# Patient Record
Sex: Female | Born: 1960 | Race: White | Hispanic: No | Marital: Single | Smoking: Never smoker
Health system: Southern US, Community
[De-identification: ages and names within clinical notes are randomized; demographics above are authoritative.]

## PROBLEM LIST (undated history)

## (undated) DIAGNOSIS — E785 Hyperlipidemia, unspecified: Secondary | ICD-10-CM

## (undated) DIAGNOSIS — M1990 Unspecified osteoarthritis, unspecified site: Secondary | ICD-10-CM

## (undated) DIAGNOSIS — F329 Major depressive disorder, single episode, unspecified: Secondary | ICD-10-CM

## (undated) DIAGNOSIS — D649 Anemia, unspecified: Secondary | ICD-10-CM

## (undated) DIAGNOSIS — I10 Essential (primary) hypertension: Secondary | ICD-10-CM

## (undated) DIAGNOSIS — F32A Depression, unspecified: Secondary | ICD-10-CM

---
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
## 1999-05-26 ENCOUNTER — Other Ambulatory Visit: Admission: RE | Admit: 1999-05-26 | Discharge: 1999-05-26 | Payer: Self-pay | Admitting: *Deleted

## 1999-08-16 ENCOUNTER — Encounter: Payer: Self-pay | Admitting: Emergency Medicine

## 1999-08-16 ENCOUNTER — Emergency Department (HOSPITAL_COMMUNITY): Admission: EM | Admit: 1999-08-16 | Discharge: 1999-08-16 | Payer: Self-pay | Admitting: Emergency Medicine

## 2000-06-05 ENCOUNTER — Other Ambulatory Visit: Admission: RE | Admit: 2000-06-05 | Discharge: 2000-06-05 | Payer: Self-pay | Admitting: *Deleted

## 2001-05-15 ENCOUNTER — Other Ambulatory Visit: Admission: RE | Admit: 2001-05-15 | Discharge: 2001-05-15 | Payer: Self-pay | Admitting: *Deleted

## 2004-12-26 ENCOUNTER — Other Ambulatory Visit: Admission: RE | Admit: 2004-12-26 | Discharge: 2004-12-26 | Payer: Self-pay | Admitting: Family Medicine

## 2019-10-07 ENCOUNTER — Inpatient Hospital Stay (HOSPITAL_COMMUNITY): Payer: 59

## 2019-10-07 ENCOUNTER — Inpatient Hospital Stay (HOSPITAL_COMMUNITY)
Admission: AD | Admit: 2019-10-07 | Discharge: 2019-10-12 | DRG: 177 | Disposition: A | Discharge status: Home / Self Care | Payer: 59 | Source: Other Acute Inpatient Hospital | Attending: Internal Medicine | Admitting: Internal Medicine

## 2019-10-07 ENCOUNTER — Encounter (HOSPITAL_COMMUNITY): Payer: Self-pay | Admitting: Family Medicine

## 2019-10-07 ENCOUNTER — Other Ambulatory Visit: Payer: Self-pay

## 2019-10-07 DIAGNOSIS — I10 Essential (primary) hypertension: Secondary | ICD-10-CM | POA: Diagnosis not present

## 2019-10-07 DIAGNOSIS — E669 Obesity, unspecified: Secondary | ICD-10-CM | POA: Diagnosis present

## 2019-10-07 DIAGNOSIS — F419 Anxiety disorder, unspecified: Secondary | ICD-10-CM | POA: Diagnosis present

## 2019-10-07 DIAGNOSIS — Z7901 Long term (current) use of anticoagulants: Secondary | ICD-10-CM | POA: Diagnosis not present

## 2019-10-07 DIAGNOSIS — J9600 Acute respiratory failure, unspecified whether with hypoxia or hypercapnia: Secondary | ICD-10-CM | POA: Diagnosis present

## 2019-10-07 DIAGNOSIS — R0600 Dyspnea, unspecified: Secondary | ICD-10-CM

## 2019-10-07 DIAGNOSIS — E785 Hyperlipidemia, unspecified: Secondary | ICD-10-CM | POA: Diagnosis present

## 2019-10-07 DIAGNOSIS — Z79899 Other long term (current) drug therapy: Secondary | ICD-10-CM | POA: Diagnosis not present

## 2019-10-07 DIAGNOSIS — Z881 Allergy status to other antibiotic agents status: Secondary | ICD-10-CM | POA: Diagnosis not present

## 2019-10-07 DIAGNOSIS — U071 COVID-19: Principal | ICD-10-CM | POA: Diagnosis present

## 2019-10-07 DIAGNOSIS — Z8249 Family history of ischemic heart disease and other diseases of the circulatory system: Secondary | ICD-10-CM

## 2019-10-07 DIAGNOSIS — E875 Hyperkalemia: Secondary | ICD-10-CM | POA: Diagnosis not present

## 2019-10-07 DIAGNOSIS — J9601 Acute respiratory failure with hypoxia: Secondary | ICD-10-CM | POA: Diagnosis not present

## 2019-10-07 DIAGNOSIS — E878 Other disorders of electrolyte and fluid balance, not elsewhere classified: Secondary | ICD-10-CM | POA: Diagnosis not present

## 2019-10-07 DIAGNOSIS — Z882 Allergy status to sulfonamides status: Secondary | ICD-10-CM | POA: Diagnosis not present

## 2019-10-07 DIAGNOSIS — Z880 Allergy status to penicillin: Secondary | ICD-10-CM | POA: Diagnosis not present

## 2019-10-07 DIAGNOSIS — J302 Other seasonal allergic rhinitis: Secondary | ICD-10-CM | POA: Diagnosis present

## 2019-10-07 DIAGNOSIS — Z6830 Body mass index (BMI) 30.0-30.9, adult: Secondary | ICD-10-CM

## 2019-10-07 DIAGNOSIS — F329 Major depressive disorder, single episode, unspecified: Secondary | ICD-10-CM | POA: Diagnosis present

## 2019-10-07 DIAGNOSIS — E871 Hypo-osmolality and hyponatremia: Secondary | ICD-10-CM | POA: Diagnosis not present

## 2019-10-07 DIAGNOSIS — J1289 Other viral pneumonia: Secondary | ICD-10-CM | POA: Diagnosis present

## 2019-10-07 DIAGNOSIS — J1282 Pneumonia due to coronavirus disease 2019: Secondary | ICD-10-CM

## 2019-10-07 HISTORY — DX: Acute respiratory failure, unspecified whether with hypoxia or hypercapnia: J96.00

## 2019-10-07 HISTORY — DX: Hyperlipidemia, unspecified: E78.5

## 2019-10-07 HISTORY — DX: Anemia, unspecified: D64.9

## 2019-10-07 HISTORY — DX: Depression, unspecified: F32.A

## 2019-10-07 HISTORY — DX: Unspecified osteoarthritis, unspecified site: M19.90

## 2019-10-07 HISTORY — DX: Essential (primary) hypertension: I10

## 2019-10-07 LAB — CBC
HCT: 38.2 % (ref 36.0–46.0)
Hemoglobin: 12.8 g/dL (ref 12.0–15.0)
MCH: 30.1 pg (ref 26.0–34.0)
MCHC: 33.5 g/dL (ref 30.0–36.0)
MCV: 89.9 fL (ref 80.0–100.0)
Platelets: 390 10*3/uL (ref 150–400)
RBC: 4.25 MIL/uL (ref 3.87–5.11)
RDW: 12.8 % (ref 11.5–15.5)
WBC: 5.7 10*3/uL (ref 4.0–10.5)
nRBC: 0.4 % — ABNORMAL HIGH (ref 0.0–0.2)

## 2019-10-07 LAB — COMPREHENSIVE METABOLIC PANEL
ALT: 50 U/L — ABNORMAL HIGH (ref 0–44)
AST: 25 U/L (ref 15–41)
Albumin: 3.3 g/dL — ABNORMAL LOW (ref 3.5–5.0)
Alkaline Phosphatase: 55 U/L (ref 38–126)
Anion gap: 12 (ref 5–15)
BUN: 14 mg/dL (ref 6–20)
CO2: 23 mmol/L (ref 22–32)
Calcium: 8.7 mg/dL — ABNORMAL LOW (ref 8.9–10.3)
Chloride: 101 mmol/L (ref 98–111)
Creatinine, Ser: 0.69 mg/dL (ref 0.44–1.00)
GFR calc Af Amer: >60 mL/min (ref >60)
GFR calc non Af Amer: >60 mL/min (ref >60)
Glucose, Bld: 142 mg/dL — ABNORMAL HIGH (ref 70–99)
Potassium: 3.7 mmol/L (ref 3.5–5.1)
Sodium: 136 mmol/L (ref 135–145)
Total Bilirubin: 0.5 mg/dL (ref 0.3–1.2)
Total Protein: 7 g/dL (ref 6.5–8.1)

## 2019-10-07 LAB — ABO/RH: ABO/RH(D): O POS

## 2019-10-07 LAB — CREATININE, SERUM
Creatinine, Ser: 0.69 mg/dL (ref 0.44–1.00)
GFR calc Af Amer: >60 mL/min (ref >60)
GFR calc non Af Amer: >60 mL/min (ref >60)

## 2019-10-07 LAB — D-DIMER, QUANTITATIVE: D-Dimer, Quant: 0.66 ug/mL-FEU — ABNORMAL HIGH (ref 0.00–0.50)

## 2019-10-07 LAB — FERRITIN: Ferritin: 532 ng/mL — ABNORMAL HIGH (ref 11–307)

## 2019-10-07 LAB — C-REACTIVE PROTEIN: CRP: 4.9 mg/dL — ABNORMAL HIGH (ref <1.0)

## 2019-10-07 MED ORDER — HYDROCOD POLST-CPM POLST ER 10-8 MG/5ML PO SUER
5.0000 mL | Freq: Two times a day (BID) | ORAL | Status: DC
  Administered 2019-10-07 – 2019-10-12 (×11): 5 mL via ORAL
  Filled 2019-10-07 (×11): qty 5

## 2019-10-07 MED ORDER — FAMOTIDINE 20 MG PO TABS: 10.0000 mg | ORAL_TABLET | Freq: Every day | ORAL | Status: DC | PRN

## 2019-10-07 MED ORDER — GUAIFENESIN-DM 100-10 MG/5ML PO SYRP
10.0000 mL | ORAL_SOLUTION | ORAL | Status: DC | PRN
  Administered 2019-10-07 – 2019-10-08 (×2): 10 mL via ORAL
  Filled 2019-10-07 (×2): qty 10

## 2019-10-07 MED ORDER — ASCORBIC ACID 500 MG PO TABS
500.0000 mg | ORAL_TABLET | Freq: Every day | ORAL | Status: DC
  Administered 2019-10-07 – 2019-10-12 (×6): 500 mg via ORAL
  Filled 2019-10-07 (×7): qty 1

## 2019-10-07 MED ORDER — ONDANSETRON HCL 4 MG PO TABS: 4.0000 mg | ORAL_TABLET | Freq: Four times a day (QID) | ORAL | Status: DC | PRN

## 2019-10-07 MED ORDER — SODIUM CHLORIDE 0.9 % IV SOLN: 200.0000 mg | Freq: Once | INTRAVENOUS | Status: DC

## 2019-10-07 MED ORDER — ONDANSETRON HCL 4 MG/2ML IJ SOLN: 4.0000 mg | Freq: Four times a day (QID) | INTRAMUSCULAR | Status: DC | PRN

## 2019-10-07 MED ORDER — SODIUM CHLORIDE 0.9% FLUSH
3.0000 mL | Freq: Two times a day (BID) | INTRAVENOUS | Status: DC
  Administered 2019-10-07 – 2019-10-09 (×5): 3 mL via INTRAVENOUS

## 2019-10-07 MED ORDER — SODIUM CHLORIDE 0.9 % IV SOLN: 100.0000 mg | Freq: Every day | INTRAVENOUS | Status: DC

## 2019-10-07 MED ORDER — ZINC SULFATE 220 (50 ZN) MG PO CAPS
220.0000 mg | ORAL_CAPSULE | Freq: Every day | ORAL | Status: DC
  Administered 2019-10-07 – 2019-10-12 (×6): 220 mg via ORAL
  Filled 2019-10-07 (×6): qty 1

## 2019-10-07 MED ORDER — ENOXAPARIN SODIUM 40 MG/0.4ML ~~LOC~~ SOLN
40.0000 mg | SUBCUTANEOUS | Status: DC
  Administered 2019-10-07 – 2019-10-12 (×6): 40 mg via SUBCUTANEOUS
  Filled 2019-10-07 (×6): qty 0.4

## 2019-10-07 MED ORDER — SODIUM CHLORIDE 0.9 % IV SOLN: 250.0000 mL | INTRAVENOUS | Status: DC | PRN

## 2019-10-07 MED ORDER — DEXAMETHASONE SODIUM PHOSPHATE 10 MG/ML IJ SOLN: 6.0000 mg | INTRAMUSCULAR | Status: DC

## 2019-10-07 MED ORDER — ENSURE ENLIVE PO LIQD
237.0000 mL | Freq: Three times a day (TID) | ORAL | Status: DC
  Administered 2019-10-07 – 2019-10-10 (×9): 237 mL via ORAL

## 2019-10-07 MED ORDER — LORATADINE 10 MG PO TABS
10.0000 mg | ORAL_TABLET | Freq: Every day | ORAL | Status: DC | PRN
  Administered 2019-10-07 – 2019-10-11 (×4): 10 mg via ORAL
  Filled 2019-10-07 (×4): qty 1

## 2019-10-07 MED ORDER — METHYLPREDNISOLONE SODIUM SUCC 40 MG IJ SOLR
35.0000 mg | Freq: Two times a day (BID) | INTRAMUSCULAR | Status: DC
  Administered 2019-10-07: 35 mg via INTRAVENOUS
  Filled 2019-10-07: qty 1

## 2019-10-07 MED ORDER — METHYLPREDNISOLONE SODIUM SUCC 40 MG IJ SOLR
40.0000 mg | Freq: Every day | INTRAMUSCULAR | Status: DC
  Administered 2019-10-08 – 2019-10-12 (×5): 40 mg via INTRAVENOUS
  Filled 2019-10-07 (×5): qty 1

## 2019-10-07 MED ORDER — SODIUM CHLORIDE 0.9 % IV SOLN
100.0000 mg | Freq: Every day | INTRAVENOUS | Status: AC
  Administered 2019-10-07 – 2019-10-10 (×4): 100 mg via INTRAVENOUS
  Filled 2019-10-07 (×4): qty 20

## 2019-10-07 MED ORDER — HYDROCOD POLST-CPM POLST ER 10-8 MG/5ML PO SUER: 5.0000 mL | Freq: Two times a day (BID) | ORAL | Status: DC | PRN

## 2019-10-07 MED ORDER — ACETAMINOPHEN 325 MG PO TABS: 650.0000 mg | ORAL_TABLET | Freq: Four times a day (QID) | ORAL | Status: DC | PRN

## 2019-10-07 MED ORDER — SODIUM CHLORIDE 0.9% FLUSH: 3.0000 mL | INTRAVENOUS | Status: DC | PRN

## 2019-10-07 NOTE — H&P (Signed)
History and Physical    Heidi Schmidt EXN:[id number] DOB: Mar 01, 1961 DOA: 10/07/2019  PCP: No primary care provider on file.  Patient coming from: Home  Chief Complaint: Shortness of breath  HPI: Heidi Schmidt is a 58 y.o. female with medical history significant of hypertension, hyperlipidemia diagnosed with Covid on 09/27/2019 seen in [place] ED on 12/14 and again on 12/17 at which point she was discharged with oral dexamethasone and 2 L of oxygen.  She continued to get worsening shortness of breath and return to emergency department more hypoxic on 2 L.  She also stopped her dexamethasone due to facial flushing and subjective fever prior.  She denies any nausea vomiting diarrhea.  She denies any chest pain.  Today she was satting 81% on 2 L had to be turned up to 15 L/min sat now at 96% on high flow nasal cannula.  She is currently in no respiratory distress she has bilateral infiltrates on chest x-ray and is referred for admission for acute hypoxic respiratory failure secondary to bilateral Covid pneumonia.  Review of Systems: As per HPI otherwise 10 point review of systems negative.   Past Medical History:  Diagnosis Date  . Anemia   . Arthritis   . Depression   . HLD (hyperlipidemia)   . Hypertension     Past Surgical History:  Procedure Laterality Date  . CESAREAN SECTION       reports that she has never smoked. She has never used smokeless tobacco. She reports previous alcohol use. She reports that she does not use drugs.  Allergies  Allergen Reactions  . Dexamethasone Other (See Comments)    facial flushing  . Cephalexin Rash  . Penicillins Rash  . Sulfa Antibiotics Rash    Family History  Problem Relation Age of Onset  . Hypertension Mother   . Diabetes Mother   . Cancer Mother   . Heart disease Mother   . Diabetes Brother   . Diabetes Maternal Grandfather     Prior to Admission medications   Medication Sig Start Date End Date Taking? Authorizing Provider    cefpodoxime (VANTIN) 200 MG tablet Take 200 mg by mouth 2 (two) times daily.   Yes [provider]  cetirizine (ZYRTEC) 10 MG chewable tablet Chew 10 mg by mouth 2 (two) times daily as needed for allergies.   Yes [provider]  famotidine (PEPCID) 10 MG tablet Take 10 mg by mouth 3 times/day as needed-between meals & bedtime for heartburn or indigestion.   Yes [provider]  glucosamine-chondroitin 500-400 MG tablet Take 1 tablet by mouth 3 (three) times daily.   Yes [provider]  rivaroxaban (XARELTO) 10 MG TABS tablet Take 10 mg by mouth 2 (two) times daily.   Yes [provider]    Physical Exam: Vitals:   10/07/19 0122 10/07/19 0219 10/07/19 0353 10/07/19 0407  BP:    (!) 143/80  Pulse:    90  Resp: (!) 21 (!) 22 (!) 22 (!) 22  Temp:    99 F (37.2 C)  TempSrc:    Oral  SpO2: 94% 96%  90%  Weight:      Height:          Constitutional: NAD, calm, comfortable Vitals:   10/07/19 0122 10/07/19 0219 10/07/19 0353 10/07/19 0407  BP:    (!) 143/80  Pulse:    90  Resp: (!) 21 (!) 22 (!) 22 (!) 22  Temp:    99 F (37.2 C)  TempSrc:    Oral  SpO2: 94% 96%  90%  Weight:      Height:       Eyes: PERRL, lids and conjunctivae normal ENMT: Mucous membranes are moist. Posterior pharynx clear of any exudate or lesions.Normal dentition.  Neck: normal, supple, no masses, no thyromegaly Respiratory: clear to auscultation bilaterally, no wheezing, no crackles. Normal respiratory effort. No accessory muscle use.  Cardiovascular: Regular rate and rhythm, no murmurs / rubs / gallops. No extremity edema. 2+ pedal pulses. No carotid bruits.  Abdomen: no tenderness, no masses palpated. No hepatosplenomegaly. Bowel sounds positive.  Musculoskeletal: no clubbing / cyanosis. No joint deformity upper and lower extremities. Good ROM, no contractures. Normal muscle tone.  Skin: no rashes, lesions, ulcers. No induration Neurologic: CN 2-12 grossly  intact. Sensation intact, DTR normal. Strength 5/5 in all 4.  Psychiatric: Normal judgment and insight. Alert and oriented x 3. Normal mood.    Labs on Admission: I have personally reviewed following labs and imaging studies  CBC: No results for input(s): WBC, NEUTROABS, HGB, HCT, MCV, PLT in the last 168 hours. Basic Metabolic Panel: No results for input(s): NA, K, CL, CO2, GLUCOSE, BUN, CREATININE, CALCIUM, MG, PHOS in the last 168 hours. GFR: CrCl cannot be calculated (No successful lab value found.). Liver Function Tests: No results for input(s): AST, ALT, ALKPHOS, BILITOT, PROT, ALBUMIN in the last 168 hours. No results for input(s): LIPASE, AMYLASE in the last 168 hours. No results for input(s): AMMONIA in the last 168 hours. Coagulation Profile: No results for input(s): INR, PROTIME in the last 168 hours. Cardiac Enzymes: No results for input(s): CKTOTAL, CKMB, CKMBINDEX, TROPONINI in the last 168 hours. BNP (last 3 results) No results for input(s): PROBNP in the last 8760 hours. HbA1C: No results for input(s): HGBA1C in the last 72 hours. CBG: No results for input(s): GLUCAP in the last 168 hours. Lipid Profile: No results for input(s): CHOL, HDL, LDLCALC, TRIG, CHOLHDL, LDLDIRECT in the last 72 hours. Thyroid Function Tests: No results for input(s): TSH, T4TOTAL, FREET4, T3FREE, THYROIDAB in the last 72 hours. Anemia Panel: No results for input(s): VITAMINB12, FOLATE, FERRITIN, TIBC, IRON, RETICCTPCT in the last 72 hours. Urine analysis: No results found for: COLORURINE, APPEARANCEUR, LABSPEC, PHURINE, GLUCOSEU, HGBUR, BILIRUBINUR, KETONESUR, PROTEINUR, UROBILINOGEN, NITRITE, LEUKOCYTESUR Sepsis Labs: !!!!!!!!!!!!!!!!!!!!!!!!!!!!!!!!!!!!!!!!!!!! @LABRCNTIP (procalcitonin:4,lacticidven:4) )No results found for this or any previous visit (from the past 240 hour(s)).   Radiological Exams on Admission: No results found.  Old chart reviewed  Assessment/Plan 58 year old  female comes in with acute hypoxic respiratory failure secondary to bilateral Covid pneumonia  Principal Problem:   Pneumonia due to COVID-19 virus-patient had a reaction to dexamethasone she is agreeable to switching to Solu-Medrol.  Does not sound like true allergy but unclear.  Placed on remdesivir.  Continue supplemental oxygen and wean as tolerates.  Also placed on Lovenox vitamin C and zinc.  Daily inflammatory markers ordered.  Patient currently mentating normally.  Active Problems:   Acute respiratory failure due to COVID-19 (HCC)-as above  Hypertension-clarify resume home meds     DVT prophylaxis: Lovenox Code Status: Full Family Communication: None Disposition Plan: Days Consults called: None Admission status: Admission   Ariadne Rissmiller A MD [facility]  If 7PM-7AM, please contact night-coverage [email or web address] Password [email or web address]  10/07/2019, 5:07 AM

## 2019-10-07 NOTE — Progress Notes (Signed)
   10/07/19 1116  Family/Significant Other Communication  Family/Significant Other Update Called;Updated (son Vicente Males)

## 2019-10-07 NOTE — Plan of Care (Signed)
Pt admitted from [facility] ED via [place] .Vitals stable on 15L non rebreather mask upon admission, weaned down as tolerated,  at 6L HFNC currently. Pt alert and oriented. No complaints verbalized. . CHG bath and skin swarm completed. Admission questions and assessment done. Oriented to unit setup and routine. On call MD paged. Telemetry monitoring started, CCMD informed. Will monitor.  Problem: Education: Goal: Knowledge of risk factors and measures for prevention of condition will improve 10/07/2019 0502 by Melvyn Neth, RN Outcome: Progressing 10/07/2019 0202 by Melvyn Neth, RN Outcome: Progressing   Problem: Coping: Goal: Psychosocial and spiritual needs will be supported 10/07/2019 0502 by Melvyn Neth, RN Outcome: Progressing 10/07/2019 0202 by Melvyn Neth, RN Outcome: Progressing   Problem: Respiratory: Goal: Will maintain a patent airway 10/07/2019 0502 by Melvyn Neth, RN Outcome: Progressing 10/07/2019 0202 by Melvyn Neth, RN Outcome: Progressing Goal: Complications related to the disease process, condition or treatment will be avoided or minimized 10/07/2019 0502 by Melvyn Neth, RN Outcome: Progressing 10/07/2019 0202 by Melvyn Neth, RN Outcome: Progressing

## 2019-10-07 NOTE — Progress Notes (Signed)
Initial Nutrition Assessment  DOCUMENTATION CODES:   Not applicable  INTERVENTION:   Liberalize diet to REGULAR  Ensure Enlive po TID, each supplement provides 350 kcal and 20 grams of protein  Pt receiving Magic cup BID with lunch and dinner, each supplement provides 290 kcal and 9 grams of protein, automatically on meal trays to optimize nutritional intake.   NUTRITION DIAGNOSIS:   Inadequate oral intake related to acute illness, decreased appetite as evidenced by per patient/family report.  GOAL:   Patient will meet greater than or equal to 90% of their needs  MONITOR:   PO intake, Supplement acceptance, Labs, Weight trends  REASON FOR ASSESSMENT:   Malnutrition Screening Tool    ASSESSMENT:   58 yo female admitted from [facility] ED who was diagnosed with COVID-19 on 12/12 and admitted with worsening respiratory failure. PMH includes HTN, HLD  Currently on 6L HFNC  No recorded po intake, no previous weight encounters  Current weight 70.2 kg  Labs: reviewed; no CBGs Meds: Vitamin C, solumedrol, zinc sulfate    Diet Order:  Heart Healthy  EDUCATION NEEDS:   Not appropriate for education at this time  Skin:  Skin Assessment: Reviewed RN Assessment  Last BM:  12/22  Height:   Ht Readings from Last 1 Encounters:  10/07/19 5' (1.524 m)    Weight:   Wt Readings from Last 1 Encounters:  10/07/19 70.2 kg    Ideal Body Weight:  45.6 kg  BMI:  Body mass index is 30.21 kg/m.  Estimated Nutritional Needs:   Kcal:  1800-2000 kcals  Protein:  90-100 g  Fluid:  >/= 1.8 L   Cate Odilia Damico MS, RDN, LDN, CNSC [phone] Pager  [phone] Weekend/On-Call Pager

## 2019-10-07 NOTE — Progress Notes (Addendum)
PROGRESS NOTE    Heidi Schmidt  OVZ:[id number] DOB: [birth year] DOA: 10/07/2019 PCP: No primary care provider on file.    Brief Narrative:  58 year old female who presented with dyspnea.  She does have significant past medical history for hypertension and dyslipidemia.  She was diagnosed with COVID-19 on September 27, 2019, presented to [facility] ED on December 14 and then again December 17, diagnosed with SARS COVID-19 viral pneumonia.  After her last ED visit she was discharged home with oral dexamethasone and supplemental oxygen per nasal cannula 2 L/min.  At home she continued to have progressive symptoms of dyspnea, cough and generalized weakness.  Her oxygen saturation at home was 85% on supplemental oxygen.  Apparently she stopped taking dexamethasone due to facial flushing and subjective fever.  On December 21 she returned to the emergency room at [facility],  her oxygen saturation was 81% on supplemental oxygen.  She had to be placed on high flow nasal cannula 15L/ minto get oxygen saturation at 96%.  Her chest radiograph showed bilateral infiltrates.  She was transferred to [facility] December 22.   At the time of transfer her blood pressure was 143/80, pulse rate 90, respiratory rate 22, temperature 99, oxygen saturation 90 to 96% on supplemental oxygen.  Her lungs were clear to auscultation bilaterally, heart S1-S2 present rhythmic, soft abdomen and no lower extremity edema.   Patient was admitted to the hospital working diagnosis of acute hypoxic respiratory failure due to SARS COVID-19 viral pneumonia.  Assessment & Plan:   Principal Problem:   Pneumonia due to COVID-19 virus Active Problems:   Acute respiratory failure due to COVID-19 [facility])   Dyslipidemia   Essential hypertension   1.  Acute hypoxic respiratory failure due to SARS COVID-19 viral pneumonia.  RR: 24  Pulse oxymetry: 94%  Fi02: 5 L/ min per HFNC  COVID-19 Labs  Recent Labs   10/07/19 0500  DDIMER 0.66*  FERRITIN 532*  CRP 4.9*    No results found for: SARSCOV2NAA  Chest radiograph personally reviewed noted bilateral interstitial infiltrates at bases, more left than right.   Patient with improved oxygen requirements. Will continue medical therapy with Remdesivir #2/5 (AST 25, ALT 50) and systemic corticosteroids with methylprednisolone (patient would like to avoid dexamethasone for now). Will continue antitussive agents and airway clearing techniques with flutter valve and incentive spirometer.   Out of bed to chair tid with meals and OT/PT evaluations.   2. HTN. Continue to hold on antihypertensive agents for now, continue blood pressure monitoring.  3, Dyslipidemia. Not on statin    DVT prophylaxis: enoxaparin   Code Status: full Family Communication: no family at the bedside  Disposition Plan/ discharge barriers: pending clinical improvement,     Subjective: Patient is feeling better, continue to have significant dyspnea on exertion, but improved at rest, no nausea or vomiting, no fever or chills, continue to have poor appetite.   Objective: Vitals:   10/07/19 0219 10/07/19 0353 10/07/19 0407 10/07/19 0548  BP:   (!) 143/80   Pulse:   90   Resp: (!) 22 (!) 22 (!) 22 (!) 24  Temp:   99 F (37.2 C)   TempSrc:   Oral   SpO2: 96%  90% 94%  Weight:      Height:        Intake/Output Summary (Last 24 hours) at 10/07/2019 0809 Last data filed at 10/07/2019 0352 Gross per 24 hour  Intake 360 ml  Output 550 ml  Net -190 ml   Filed Weights   10/07/19 0037  Weight: 70.2 kg    Examination:   General: deconditioned  Neurology: Awake and alert, non focal  E ENT: no pallor, no icterus, oral mucosa moist Cardiovascular: No JVD. S1-S2 present, rhythmic, no gallops, rubs, or murmurs. No lower extremity edema. Pulmonary: positive breath sounds bilaterally. Gastrointestinal. Abdomen with no organomegaly, non tender, no rebound or  guarding Skin. No rashes Musculoskeletal: no joint deformities     Data Reviewed: I have personally reviewed following labs and imaging studies  CBC: Recent Labs  Lab 10/07/19 0500  WBC 5.7  HGB 12.8  HCT 38.2  MCV 89.9  PLT 841   Basic Metabolic Panel: No results for input(s): NA, K, CL, CO2, GLUCOSE, BUN, CREATININE, CALCIUM, MG, PHOS in the last 168 hours. GFR: CrCl cannot be calculated (No successful lab value found.). Liver Function Tests: No results for input(s): AST, ALT, ALKPHOS, BILITOT, PROT, ALBUMIN in the last 168 hours. No results for input(s): LIPASE, AMYLASE in the last 168 hours. No results for input(s): AMMONIA in the last 168 hours. Coagulation Profile: No results for input(s): INR, PROTIME in the last 168 hours. Cardiac Enzymes: No results for input(s): CKTOTAL, CKMB, CKMBINDEX, TROPONINI in the last 168 hours. BNP (last 3 results) No results for input(s): PROBNP in the last 8760 hours. HbA1C: No results for input(s): HGBA1C in the last 72 hours. CBG: No results for input(s): GLUCAP in the last 168 hours. Lipid Profile: No results for input(s): CHOL, HDL, LDLCALC, TRIG, CHOLHDL, LDLDIRECT in the last 72 hours. Thyroid Function Tests: No results for input(s): TSH, T4TOTAL, FREET4, T3FREE, THYROIDAB in the last 72 hours. Anemia Panel: No results for input(s): VITAMINB12, FOLATE, FERRITIN, TIBC, IRON, RETICCTPCT in the last 72 hours.    Radiology Studies: I have reviewed all of the imaging during this hospital visit personally     Scheduled Meds: . vitamin C  500 mg Oral Daily  . enoxaparin (LOVENOX) injection  40 mg Subcutaneous Q24H  . methylPREDNISolone (SOLU-MEDROL) injection  35 mg Intravenous Q12H  . sodium chloride flush  3 mL Intravenous Q12H  . zinc sulfate  220 mg Oral Daily   Continuous Infusions: . sodium chloride    . remdesivir 100 mg in NS 100 mL       LOS: 0 days        Dushaun Okey Gerome Apley, MD

## 2019-10-08 DIAGNOSIS — J9601 Acute respiratory failure with hypoxia: Secondary | ICD-10-CM

## 2019-10-08 LAB — COMPREHENSIVE METABOLIC PANEL
ALT: 44 U/L (ref 0–44)
AST: 23 U/L (ref 15–41)
Albumin: 3 g/dL — ABNORMAL LOW (ref 3.5–5.0)
Alkaline Phosphatase: 53 U/L (ref 38–126)
Anion gap: 10 (ref 5–15)
BUN: 20 mg/dL (ref 6–20)
CO2: 26 mmol/L (ref 22–32)
Calcium: 8.9 mg/dL (ref 8.9–10.3)
Chloride: 101 mmol/L (ref 98–111)
Creatinine, Ser: 0.88 mg/dL (ref 0.44–1.00)
GFR calc Af Amer: >60 mL/min (ref >60)
GFR calc non Af Amer: >60 mL/min (ref >60)
Glucose, Bld: 121 mg/dL — ABNORMAL HIGH (ref 70–99)
Potassium: 3.8 mmol/L (ref 3.5–5.1)
Sodium: 137 mmol/L (ref 135–145)
Total Bilirubin: 0.5 mg/dL (ref 0.3–1.2)
Total Protein: 6.4 g/dL — ABNORMAL LOW (ref 6.5–8.1)

## 2019-10-08 LAB — C-REACTIVE PROTEIN: CRP: 2.8 mg/dL — ABNORMAL HIGH (ref <1.0)

## 2019-10-08 LAB — D-DIMER, QUANTITATIVE: D-Dimer, Quant: 0.93 ug/mL-FEU — ABNORMAL HIGH (ref 0.00–0.50)

## 2019-10-08 LAB — FERRITIN: Ferritin: 472 ng/mL — ABNORMAL HIGH (ref 11–307)

## 2019-10-08 MED ORDER — TRAZODONE HCL 50 MG PO TABS: 50.0000 mg | ORAL_TABLET | Freq: Every evening | ORAL | Status: DC | PRN

## 2019-10-08 MED ORDER — FAMOTIDINE 20 MG PO TABS
20.0000 mg | ORAL_TABLET | Freq: Every day | ORAL | Status: DC
  Administered 2019-10-09 – 2019-10-12 (×4): 20 mg via ORAL
  Filled 2019-10-08 (×3): qty 1

## 2019-10-08 MED ORDER — POLYVINYL ALCOHOL 1.4 % OP SOLN
1.0000 [drp] | OPHTHALMIC | Status: DC | PRN
  Filled 2019-10-08: qty 15

## 2019-10-08 NOTE — Progress Notes (Addendum)
Occupational Therapy Evaluation Only Patient Details Name: Heidi Schmidt MRN: [id number] DOB: 07/19/1961 Today's Date: 10/08/2019    History of Present Illness Pt is a 58 y.o. female admitted 10/07/19 with dyspnea; worked up for acute hypoxic respiratory failure due to SARS COVID-19 viral pneumonia. (+) COVID-19 on 09/27/19 with multiple ED visits, sent home with 2L O2 (per pt). PMH includes HTN, arthritis, depression.   Clinical Impression   PTA pt lived alone, independent in all ADL, IADL, and mobility tasks. 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 Pt does not use oxygen at home and is currently on 2L [place]. Pt able to ambulate to/from bathroom independently, complete toileting task as well as grooming/hygiene at the sink on room air. Pt's SpO2 decreased to 87% during task. Unable to increase oxygen saturation on room air with 2L [place] donned and SpO2 increasing to 94%. 1/4 DOE. Educated and provided pt with handouts regarding energy conservation and relaxation strategies. Pt demo good understanding and follow through of breathing exercises. Pt independent in room with self-care and functional transfer tasks. Skilled OT services not warranted at this time as pt appears to be functioning near baseline for self-care and functional transfer tasks.    Follow Up Recommendations  No OT follow up    Equipment Recommendations  None recommended by OT    Recommendations for Other Services Speech consult. Pt reported to OT that she's only able to take small sips of water and small bites of food or else she starts choking and coughing.     Precautions / Restrictions Precautions Precautions: None Restrictions Weight Bearing Restrictions: No      Mobility Bed Mobility Overal bed mobility: Modified Independent             General bed mobility comments: Pt seated on BSC upon OT arrival.  Transfers Overall transfer level: Independent Equipment used: None             General  transfer comment: Pt able to transfer to/from [facility] independently as well as to/from bedside chair.     Balance Overall balance assessment: No apparent balance deficits (not formally assessed)                                         ADL either performed or assessed with clinical judgement   ADL Overall ADL's : Modified independent                                       General ADL Comments: Pt able to complete self-care and functional transfer tasks requiring increased time to complete     Vision Baseline Vision/History: Wears glasses Wears Glasses: At all times       Perception     Praxis      Pertinent Vitals/Pain Pain Assessment: No/denies pain     Hand Dominance Right   Extremity/Trunk Assessment Upper Extremity Assessment Upper Extremity Assessment: Overall WFL for tasks assessed   Lower Extremity Assessment Lower Extremity Assessment: Defer to PT evaluation   Cervical / Trunk Assessment Cervical / Trunk Assessment: Normal   Communication Communication Communication: No difficulties   Cognition Arousal/Alertness: Awake/alert Behavior During Therapy: Flat affect Overall Cognitive Status: Within Functional Limits for tasks assessed  General Comments: Reports having gotten no sleep   General Comments  Educated and provided pt with handouts regarding energy conservation and relaxation strategies. Pt able to ambulate to/from bathroom and complete toileting as well as grooming/hygiene tasks on room air. SpO2 decreased to 87% with report of min SOB. Unable to increase SpO2 on room air with 2L donned. SpO2 at 94% at end of session.     Exercises Exercises: Other exercises Other Exercises Other Exercises: Incentive spirometer x 10. Pulling Other Exercises: Flutter valve x 10    Shoulder Instructions      Home Living Family/patient expects to be discharged to:: Private  residence Living Arrangements: Alone Available Help at Discharge: Family;Available PRN/intermittently Type of Home: House       Home Layout: One level     Bathroom Shower/Tub: Chief Strategy Officer: Handicapped height     Home Equipment: None          Prior Functioning/Environment Level of Independence: Independent        Comments: Pt independent in ADLs, IADLs, and mobility. Pt does not ambulate with an assistive device. Pt reports 0 falls in the last 6 months. Pt still drives. Pt works full time in data entry.        OT Problem List: Cardiopulmonary status limiting activity      OT Treatment/Interventions:      OT Goals(Current goals can be found in the care plan section) Acute Rehab OT Goals Patient Stated Goal: To go home  OT Frequency:     Barriers to D/C:            Co-evaluation              AM-PAC OT "6 Clicks" Daily Activity     Outcome Measure Help from another person eating meals?: None Help from another person taking care of personal grooming?: None Help from another person toileting, which includes using toliet, bedpan, or urinal?: None Help from another person bathing (including washing, rinsing, drying)?: None Help from another person to put on and taking off regular upper body clothing?: None Help from another person to put on and taking off regular lower body clothing?: None 6 Click Score: 24   End of Session Equipment Utilized During Treatment: Oxygen Nurse Communication: Mobility status  Activity Tolerance: Patient tolerated treatment well Patient left: in chair;with call bell/phone within reach  OT Visit Diagnosis: Muscle weakness (generalized) (M62.81)                Time: 1323-1400 OT Time Calculation (min): 37 min Charges:  OT General Charges $OT Visit: 1 Visit OT Evaluation $OT Eval Low Complexity: 1 Low OT Treatments $Self Care/Home Management : 8-22 mins  Peterson Ao OTR/L [phone]   Peterson Ao 10/08/2019, 3:38 PM

## 2019-10-08 NOTE — Progress Notes (Signed)
PROGRESS NOTE  Heidi Schmidt [id number]:[id number] DOB: Jan 06, 1961 DOA: 10/07/2019  PCP: [facility]  Brief History/Interval Summary: 58 year old Caucasian female with past medical history of hypertension dyslipidemia was diagnosed with COVID-19 on December 12.  Presented to [facility] emergency department on December 14 and then on December 17.  She was discharged from the ED the second go around on oral dexamethasone and supplemental oxygen.  However her symptoms continue to get worse.  She presented back to the emergency department and had to be placed on 15 L of oxygen.  She was subsequently hospitalized for further management  Reason for Visit: Acute respiratory failure with hypoxia.  Pneumonia due to COVID-19.  Consultants: None  Procedures: None  Antibiotics: Anti-infectives (From admission, onward)   Start     Dose/Rate Route Frequency Ordered Stop   10/08/19 1000  remdesivir 100 mg in sodium chloride 0.9 % 100 mL IVPB  Status:  Discontinued     100 mg 200 mL/hr over 30 Minutes Intravenous Daily 10/07/19 0103 10/07/19 0106   10/07/19 1000  remdesivir 100 mg in sodium chloride 0.9 % 100 mL IVPB     100 mg 200 mL/hr over 30 Minutes Intravenous Daily 10/07/19 0141 10/11/19 0959   10/07/19 0115  remdesivir 200 mg in sodium chloride 0.9% 250 mL IVPB  Status:  Discontinued     200 mg 580 mL/hr over 30 Minutes Intravenous Once 10/07/19 0103 10/07/19 0106      Subjective/Interval History: Patient states that she is feeling slightly better.  Not as short of breath as before.  Feels anxious.  Has not been able to sleep.  Diarrhea is improving.  No nausea or vomiting.    Assessment/Plan:  Acute Hypoxic Resp. Failure/Pneumonia due to COVID-19   Recent Labs  Lab 10/07/19 0500 10/08/19 0045  DDIMER 0.66* 0.93*  FERRITIN 532* 472*  CRP 4.9* 2.8*  ALT 50* 44    Objective findings: Fever: Remains afebrile Oxygen requirements: on 4 L of oxygen by nasal cannula.   Saturating in the early 90s.  COVID 19 Therapeutics: Antibacterials: None Remdesivir: Day 3 Steroids: Solu-Medrol 40 mg daily Diuretics: Not on Lasix on a scheduled basis Actemra: Not given Convalescent Plasma: Not given Vitamin C and Zinc: Continue PUD Prophylaxis: Initiate Pepcid DVT Prophylaxis:  Lovenox  Patient seems to be improving from a respiratory standpoint.  She is down to 4 L of oxygen by nasal cannula.  Normal work of breathing this morning.  Continue to wean down oxygen.  Continue remdesivir and steroids.  Inflammatory markers improved with CRP down to 2.8.  D-dimer 0.93.  Mobilize.  Incentive spirometry.  Out of bed to chair.  Essential hypertension Holding antihypertensives.  Continue to monitor blood pressures  Dyslipidemia Not noted to be on statin at home.  Obesity Estimated body mass index is 30.21 kg/m as calculated from the following:   Height as of this encounter: 5' (1.524 m).   Weight as of this encounter: 70.2 kg.   DVT Prophylaxis: Lovenox Code Status: Full code Family Communication: Discussed with the patient Disposition Plan: Management as outlined above.  Hopefully home when improved.   Medications:  Scheduled: . vitamin C  500 mg Oral Daily  . chlorpheniramine-HYDROcodone  5 mL Oral Q12H  . enoxaparin (LOVENOX) injection  40 mg Subcutaneous Q24H  . feeding supplement (ENSURE ENLIVE)  237 mL Oral TID BM  . methylPREDNISolone (SOLU-MEDROL) injection  40 mg Intravenous Daily  . sodium chloride flush  3 mL Intravenous Q12H  .  zinc sulfate  220 mg Oral Daily   Continuous: . sodium chloride    . remdesivir 100 mg in NS 100 mL 100 mg (10/08/19 0920)   UMP:NTIRWE chloride, acetaminophen, famotidine, guaiFENesin-dextromethorphan, loratadine, ondansetron **OR** ondansetron (ZOFRAN) IV, sodium chloride flush, traZODone   Objective:  Vital Signs  Vitals:   10/07/19 1145 10/07/19 2036 10/08/19 0400 10/08/19 0800  BP: 134/87 (!) 157/89 (!)  146/82 122/85  Pulse: (!) 107 97 (!) 102 97  Resp: 20 20 20  (!) 22  Temp: 99.2 F (37.3 C) 98.9 F (37.2 C) 98.3 F (36.8 C) 97.7 F (36.5 C)  TempSrc: Oral Oral Oral Oral  SpO2: (!) 89% 93% 92% 94%  Weight:      Height:        Intake/Output Summary (Last 24 hours) at 10/08/2019 1443 Last data filed at 10/08/2019 1000 Gross per 24 hour  Intake 590 ml  Output 1000 ml  Net -410 ml   Filed Weights   10/07/19 0037  Weight: 70.2 kg    General appearance: Awake alert.  In no distress Resp: Mildly tachypneic at rest.  Coarse breath sound bilaterally.  No wheezing or rhonchi.  Crackles at the bases. Cardio: S1-S2 is normal regular.  No S3-S4.  No rubs murmurs or bruit GI: Abdomen is soft.  Nontender nondistended.  Bowel sounds are present normal.  No masses organomegaly Extremities: No edema.  Full range of motion of lower extremities. Neurologic: Alert and oriented x3.  No focal neurological deficits.    Lab Results:  Data Reviewed: I have personally reviewed following labs and imaging studies  CBC: Recent Labs  Lab 10/07/19 0500  WBC 5.7  HGB 12.8  HCT 38.2  MCV 89.9  PLT 315    Basic Metabolic Panel: Recent Labs  Lab 10/07/19 0500 10/08/19 0045  NA 136 137  K 3.7 3.8  CL 101 101  CO2 23 26  GLUCOSE 142* 121*  BUN 14 20  CREATININE 0.69  0.69 0.88  CALCIUM 8.7* 8.9    GFR: Estimated Creatinine Clearance: 60.9 mL/min (by C-G formula based on SCr of 0.88 mg/dL).  Liver Function Tests: Recent Labs  Lab 10/07/19 0500 10/08/19 0045  AST 25 23  ALT 50* 44  ALKPHOS 55 53  BILITOT 0.5 0.5  PROT 7.0 6.4*  ALBUMIN 3.3* 3.0*    Anemia Panel: Recent Labs    10/07/19 0500 10/08/19 0045  FERRITIN 532* 472*      Radiology Studies: DG Chest 1 View  Result Date: 10/07/2019 CLINICAL DATA:  Dyspnea.  COVID-19 infection. EXAM: CHEST  1 VIEW COMPARISON:  Radiographs 10/06/2019 and 10/02/2019. FINDINGS: 1024 hours. Slightly lower lung volumes. The  heart size and mediastinal contours are stable. There are patchy ground-glass opacities in both lungs which are not significantly changed from the most recent study allowing for the lesser degree of inspiration on today's examination. There is no pleural effusion or pneumothorax. The bones appear unchanged. IMPRESSION: No significant change in patchy bilateral ground-glass opacities consistent with viral pneumonia since yesterday. No new findings demonstrated. Electronically Signed   By: Richardean Sale M.D.   On: 10/07/2019 10:31       LOS: 1 day   [facility] Pager on [email or web address]  10/08/2019, 2:43 PM

## 2019-10-08 NOTE — Evaluation (Signed)
Physical Therapy Evaluation & Discharge Patient Details Name: Heidi Schmidt MRN: [id number] DOB: 1961/07/27 Today's Date: 10/08/2019   History of Present Illness  Pt is a 58 y.o. female admitted 10/07/19 with dyspnea; worked up for acute hypoxic respiratory failure due to SARS COVID-19 viral pneumonia. (+) COVID-19 on 09/27/19 with multiple ED visits, sent home with 2L O2 (per pt). PMH includes HTN, arthritis, depression.    Clinical Impression  Patient evaluated by Physical Therapy with no further acute PT needs identified. PTA, pt independent, lives alone and works. Today, pt independent with mobility and ADL tasks. SpO2 >/87% on 3L O2 with ambulation. Encouraged more frequent OOB/standing activity. Reviewed IS use. All education has been completed and the patient has no further questions. Acute PT is signing off. Thank you for this referral.    Follow Up Recommendations No PT follow up    Equipment Recommendations  None recommended by PT    Recommendations for Other Services       Precautions / Restrictions Precautions Precautions: None Restrictions Weight Bearing Restrictions: No      Mobility  Bed Mobility Overal bed mobility: Modified Independent             General bed mobility comments: HOB elevated  Transfers Overall transfer level: Independent Equipment used: None             General transfer comment: Transferring to/from [facility] independently  Ambulation/Gait Ambulation/Gait assistance: Independent Gait Distance (Feet): 300 Feet Assistive device: None Gait Pattern/deviations: Step-through pattern;Decreased stride length   Gait velocity interpretation: 1.31 - 2.62 ft/sec, indicative of limited community ambulator General Gait Details: Initial slow, guarded gait pt reports due to chronic R hip pain; gait speed improving with distance. SpO2 87-92% on 3L O2 [place]  Stairs            Wheelchair Mobility    Modified Rankin (Stroke Patients Only)        Balance Overall balance assessment: No apparent balance deficits (not formally assessed)                                           Pertinent Vitals/Pain Pain Assessment: No/denies pain    Home Living Family/patient expects to be discharged to:: Private residence Living Arrangements: Alone Available Help at Discharge: Family;Available PRN/intermittently Type of Home: House       Home Layout: One level Home Equipment: None      Prior Function Level of Independence: Independent         Comments: Works in Location manager, 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     Journalist, newspaper        Extremity/Trunk Assessment   Upper Extremity Assessment Upper Extremity Assessment: Overall WFL for tasks assessed    Lower Extremity Assessment Lower Extremity Assessment: Overall WFL for tasks assessed    Cervical / Trunk Assessment Cervical / Trunk Assessment: Normal  Communication   Communication: No difficulties  Cognition Arousal/Alertness: Awake/alert Behavior During Therapy: Flat affect Overall Cognitive Status: Within Functional Limits for tasks assessed                                 General Comments: Reports having gotten no sleep      General Comments      Exercises Other Exercises Other Exercises: Since pt limited by lines/O2, educ on  repeated sit<>stands and marching in place; encouraged at least 1x/day ambulating in hallway with nurse to set-up O2 tank, etc.   Assessment/Plan    PT Assessment Patent does not need any further PT services  PT Problem List         PT Treatment Interventions      PT Goals (Current goals can be found in the Care Plan section)  Acute Rehab PT Goals Patient Stated Goal: "I'll be fine at home as long as I can breathe" PT Goal Formulation: All assessment and education complete, DC therapy    Frequency     Barriers to discharge        Co-evaluation               AM-PAC PT "6  Clicks" Mobility  Outcome Measure Help needed turning from your back to your side while in a flat bed without using bedrails?: None Help needed moving from lying on your back to sitting on the side of a flat bed without using bedrails?: None Help needed moving to and from a bed to a chair (including a wheelchair)?: None Help needed standing up from a chair using your arms (e.g., wheelchair or bedside chair)?: None Help needed to walk in hospital room?: None Help needed climbing 3-5 steps with a railing? : None 6 Click Score: 24    End of Session Equipment Utilized During Treatment: Oxygen Activity Tolerance: Patient tolerated treatment well Patient left: in bed;with call bell/phone within reach Nurse Communication: Mobility status PT Visit Diagnosis: Other abnormalities of gait and mobility (R26.89)    Time: 7124-5809 PT Time Calculation (min) (ACUTE ONLY): 34 min   Charges:   PT Evaluation $PT Eval Low Complexity: 1 Low PT Treatments $Therapeutic Exercise: 8-22 mins      Heidi Schmidt, PT, DPT Acute Rehabilitation Services  Pager [phone] Office [phone]  Malachy Chamber 10/08/2019, 12:05 PM

## 2019-10-09 LAB — COMPREHENSIVE METABOLIC PANEL
ALT: 50 U/L — ABNORMAL HIGH (ref 0–44)
AST: 23 U/L (ref 15–41)
Albumin: 3 g/dL — ABNORMAL LOW (ref 3.5–5.0)
Alkaline Phosphatase: 50 U/L (ref 38–126)
Anion gap: 11 (ref 5–15)
BUN: 17 mg/dL (ref 6–20)
CO2: 26 mmol/L (ref 22–32)
Calcium: 8.8 mg/dL — ABNORMAL LOW (ref 8.9–10.3)
Chloride: 100 mmol/L (ref 98–111)
Creatinine, Ser: 0.72 mg/dL (ref 0.44–1.00)
GFR calc Af Amer: >60 mL/min (ref >60)
GFR calc non Af Amer: >60 mL/min (ref >60)
Glucose, Bld: 92 mg/dL (ref 70–99)
Potassium: 3.7 mmol/L (ref 3.5–5.1)
Sodium: 137 mmol/L (ref 135–145)
Total Bilirubin: 0.5 mg/dL (ref 0.3–1.2)
Total Protein: 6.3 g/dL — ABNORMAL LOW (ref 6.5–8.1)

## 2019-10-09 LAB — D-DIMER, QUANTITATIVE: D-Dimer, Quant: 0.99 ug/mL-FEU — ABNORMAL HIGH (ref 0.00–0.50)

## 2019-10-09 LAB — FERRITIN: Ferritin: 524 ng/mL — ABNORMAL HIGH (ref 11–307)

## 2019-10-09 LAB — C-REACTIVE PROTEIN: CRP: 4.8 mg/dL — ABNORMAL HIGH (ref <1.0)

## 2019-10-09 MED ORDER — SODIUM CHLORIDE 0.9% FLUSH
10.0000 mL | Freq: Two times a day (BID) | INTRAVENOUS | Status: DC
  Administered 2019-10-09 – 2019-10-12 (×6): 10 mL

## 2019-10-09 MED ORDER — SODIUM CHLORIDE 0.9% FLUSH: 10.0000 mL | INTRAVENOUS | Status: DC | PRN

## 2019-10-09 MED ORDER — SALINE SPRAY 0.65 % NA SOLN
1.0000 | NASAL | Status: DC | PRN
  Filled 2019-10-09: qty 44

## 2019-10-09 NOTE — Progress Notes (Signed)
Patient saturating 79% on room while ambulating. Patient saturating 90% on 4L [place] while ambulating.

## 2019-10-09 NOTE — Progress Notes (Signed)
PROGRESS NOTE  Heidi Schmidt PXT:[id number] DOB: [birth year] DOA: 10/07/2019  PCP: [facility]  Brief History/Interval Summary: 58 year old Caucasian female with past medical history of hypertension dyslipidemia was diagnosed with COVID-19 on December 12.  Presented to [facility] emergency department on December 14 and then on December 17.  She was discharged from the ED the second go around on oral dexamethasone and supplemental oxygen.  However her symptoms continue to get worse.  She presented back to the emergency department and had to be placed on 15 L of oxygen.  She was subsequently hospitalized for further management  Reason for Visit: Acute respiratory failure with hypoxia.  Pneumonia due to COVID-19.  Consultants: None  Procedures: None  Antibiotics: Anti-infectives (From admission, onward)   Start     Dose/Rate Route Frequency Ordered Stop   10/08/19 1000  remdesivir 100 mg in sodium chloride 0.9 % 100 mL IVPB  Status:  Discontinued     100 mg 200 mL/hr over 30 Minutes Intravenous Daily 10/07/19 0103 10/07/19 0106   10/07/19 1000  remdesivir 100 mg in sodium chloride 0.9 % 100 mL IVPB     100 mg 200 mL/hr over 30 Minutes Intravenous Daily 10/07/19 0141 10/11/19 0959   10/07/19 0115  remdesivir 200 mg in sodium chloride 0.9% 250 mL IVPB  Status:  Discontinued     200 mg 580 mL/hr over 30 Minutes Intravenous Once 10/07/19 0103 10/07/19 0106      Subjective/Interval History: Patient mentions that she got more short of breath overnight.  She admits to getting very anxious when she is not able to check her oxygen levels.  Does not appear to be any more short of breath than yesterday.  Her IV infiltrated this morning.      Assessment/Plan:  Acute Hypoxic Resp. Failure/Pneumonia due to COVID-19   Recent Labs  Lab 10/07/19 0500 10/08/19 0045 10/09/19 0425  DDIMER 0.66* 0.93* 0.99*  FERRITIN 532* 472* 524*  CRP 4.9* 2.8* 4.8*  ALT 50* 44 50*     Objective findings: Fever: Remains afebrile Oxygen requirements: Noted to be on 5 L of oxygen this morning saturating in the mid 90s.  Oxygen was turned down to 3 L.    COVID 19 Therapeutics: Antibacterials: None Remdesivir: Day 4 Steroids: Solu-Medrol 40 mg daily Diuretics: Not on Lasix on a scheduled basis Actemra: Not given Convalescent Plasma: Not given Vitamin C and Zinc: Continue PUD Prophylaxis: Initiate Pepcid DVT Prophylaxis:  Lovenox  Patient seems to be relatively stable from a respiratory standpoint.  She is very anxious which is part of the issue.  Continue with remdesivir and steroids.  Inflammatory markers noted.  D-dimer is 0.99.  Ferritin 524.  CRP went up to 4.8 today.  ALT 50.  Continue to mobilize, incentive spirometry, out of bed to chair.  Essential hypertension Blood pressure noted to be in hypertensive range.  She apparently does not take any prescription medications at home.  Part of the elevated blood pressure could be due to anxiety.  Continue to monitor for now.    Dyslipidemia Not noted to be on statin at home.  Obesity Estimated body mass index is 30.21 kg/m as calculated from the following:   Height as of this encounter: 5' (1.524 m).   Weight as of this encounter: 70.2 kg.   DVT Prophylaxis: Lovenox Code Status: Full code Family Communication: Discussed with the patient.  Son will be updated later today. Disposition Plan: Management as outlined above.  Hopefully home  when improved.   Medications:  Scheduled: . vitamin C  500 mg Oral Daily  . chlorpheniramine-HYDROcodone  5 mL Oral Q12H  . enoxaparin (LOVENOX) injection  40 mg Subcutaneous Q24H  . famotidine  20 mg Oral Daily  . feeding supplement (ENSURE ENLIVE)  237 mL Oral TID BM  . methylPREDNISolone (SOLU-MEDROL) injection  40 mg Intravenous Daily  . sodium chloride flush  3 mL Intravenous Q12H  . zinc sulfate  220 mg Oral Daily   Continuous: . sodium chloride    . remdesivir  100 mg in NS 100 mL 100 mg (10/09/19 0924)   HBZ:JIRCVE chloride, acetaminophen, guaiFENesin-dextromethorphan, loratadine, ondansetron **OR** ondansetron (ZOFRAN) IV, polyvinyl alcohol, sodium chloride, sodium chloride flush, traZODone   Objective:  Vital Signs  Vitals:   10/08/19 1600 10/08/19 2000 10/09/19 0400 10/09/19 0824  BP: (!) 151/89 (!) 148/96 (!) 158/98 (!) 155/76  Pulse: (!) 108 (!) 106 (!) 108 99  Resp: 20 20 20    Temp: 98.8 F (37.1 C) 98.5 F (36.9 C) 98.9 F (37.2 C) 98.1 F (36.7 C)  TempSrc: Oral Oral Oral Oral  SpO2: 95% 95% 93% 90%  Weight:      Height:        Intake/Output Summary (Last 24 hours) at 10/09/2019 1302 Last data filed at 10/09/2019 0400 Gross per 24 hour  Intake 240 ml  Output --  Net 240 ml   Filed Weights   10/07/19 0037  Weight: 70.2 kg    General appearance: Awake alert.  In no distress.  Anxious Resp: Normal effort at rest.  Coarse breath sound bilaterally.  Crackles at the bases.  No wheezing or rhonchi.   Cardio: S1-S2 is normal regular.  No S3-S4.  No rubs murmurs or bruit GI: Abdomen is soft.  Nontender nondistended.  Bowel sounds are present normal.  No masses organomegaly Extremities: No edema.  Full range of motion of lower extremities. Neurologic: Alert and oriented x3.  No focal neurological deficits.     Lab Results:  Data Reviewed: I have personally reviewed following labs and imaging studies  CBC: Recent Labs  Lab 10/07/19 0500  WBC 5.7  HGB 12.8  HCT 38.2  MCV 89.9  PLT 390    Basic Metabolic Panel: Recent Labs  Lab 10/07/19 0500 10/08/19 0045 10/09/19 0425  NA 136 137 137  K 3.7 3.8 3.7  CL 101 101 100  CO2 23 26 26   GLUCOSE 142* 121* 92  BUN 14 20 17   CREATININE 0.69  0.69 0.88 0.72  CALCIUM 8.7* 8.9 8.8*    GFR: Estimated Creatinine Clearance: 67 mL/min (by C-G formula based on SCr of 0.72 mg/dL).  Liver Function Tests: Recent Labs  Lab 10/07/19 0500 10/08/19 0045 10/09/19 0425   AST 25 23 23   ALT 50* 44 50*  ALKPHOS 55 53 50  BILITOT 0.5 0.5 0.5  PROT 7.0 6.4* 6.3*  ALBUMIN 3.3* 3.0* 3.0*    Anemia Panel: Recent Labs    10/08/19 0045 10/09/19 0425  FERRITIN 472* 524*      Radiology Studies: No results found.     LOS: 2 days   Marieta Markov 10/11/19  [facility] Pager on [email or web address]  10/09/2019, 1:02 PM

## 2019-10-09 NOTE — Progress Notes (Signed)
Notified dr Maryland Pink of patient's elevated Heart rate via messaging system. Patient asymptomatic, but unusual for patient's HR to be elevated at 115 sustained at rest. Will continue to monitor for now and wait for any orders.

## 2019-10-09 NOTE — Progress Notes (Signed)
Dr Maryland Pink just wants me to monitor the patient's heart rate for now. Patient denies anxiety and will not take anxiety medications per dr Maryland Pink.

## 2019-10-09 NOTE — Plan of Care (Signed)
  Problem: Education: Goal: Knowledge of risk factors and measures for prevention of condition will improve Outcome: Progressing   Problem: Coping: Goal: Psychosocial and spiritual needs will be supported Outcome: Progressing   Problem: Respiratory: Goal: Will maintain a patent airway Outcome: Progressing Goal: Complications related to the disease process, condition or treatment will be avoided or minimized Outcome: Progressing   

## 2019-10-10 ENCOUNTER — Inpatient Hospital Stay (HOSPITAL_COMMUNITY): Payer: 59

## 2019-10-10 LAB — CBC
HCT: 42.3 % (ref 36.0–46.0)
Hemoglobin: 13.8 g/dL (ref 12.0–15.0)
MCH: 30.3 pg (ref 26.0–34.0)
MCHC: 32.6 g/dL (ref 30.0–36.0)
MCV: 92.8 fL (ref 80.0–100.0)
Platelets: 403 10*3/uL — ABNORMAL HIGH (ref 150–400)
RBC: 4.56 MIL/uL (ref 3.87–5.11)
RDW: 13.2 % (ref 11.5–15.5)
WBC: 10.4 10*3/uL (ref 4.0–10.5)
nRBC: 0 % (ref 0.0–0.2)

## 2019-10-10 LAB — COMPREHENSIVE METABOLIC PANEL
ALT: 54 U/L — ABNORMAL HIGH (ref 0–44)
AST: 25 U/L (ref 15–41)
Albumin: 3.6 g/dL (ref 3.5–5.0)
Alkaline Phosphatase: 59 U/L (ref 38–126)
Anion gap: 15 (ref 5–15)
BUN: 17 mg/dL (ref 6–20)
CO2: 30 mmol/L (ref 22–32)
Calcium: 9.2 mg/dL (ref 8.9–10.3)
Chloride: 94 mmol/L — ABNORMAL LOW (ref 98–111)
Creatinine, Ser: 0.77 mg/dL (ref 0.44–1.00)
GFR calc Af Amer: >60 mL/min (ref >60)
GFR calc non Af Amer: >60 mL/min (ref >60)
Glucose, Bld: 133 mg/dL — ABNORMAL HIGH (ref 70–99)
Potassium: 3.4 mmol/L — ABNORMAL LOW (ref 3.5–5.1)
Sodium: 139 mmol/L (ref 135–145)
Total Bilirubin: 0.9 mg/dL (ref 0.3–1.2)
Total Protein: 7.6 g/dL (ref 6.5–8.1)

## 2019-10-10 LAB — D-DIMER, QUANTITATIVE: D-Dimer, Quant: 0.79 ug/mL-FEU — ABNORMAL HIGH (ref 0.00–0.50)

## 2019-10-10 LAB — FERRITIN: Ferritin: 585 ng/mL — ABNORMAL HIGH (ref 11–307)

## 2019-10-10 LAB — C-REACTIVE PROTEIN: CRP: 6.6 mg/dL — ABNORMAL HIGH (ref <1.0)

## 2019-10-10 MED ORDER — POTASSIUM CHLORIDE 20 MEQ/15ML (10%) PO SOLN
40.0000 meq | Freq: Once | ORAL | Status: AC
  Administered 2019-10-10: 40 meq via ORAL
  Filled 2019-10-10: qty 30

## 2019-10-10 MED ORDER — FUROSEMIDE 10 MG/ML IJ SOLN
40.0000 mg | Freq: Once | INTRAMUSCULAR | Status: AC
  Administered 2019-10-10: 40 mg via INTRAVENOUS
  Filled 2019-10-10: qty 4

## 2019-10-10 MED ORDER — POTASSIUM CHLORIDE CRYS ER 20 MEQ PO TBCR
40.0000 meq | EXTENDED_RELEASE_TABLET | Freq: Two times a day (BID) | ORAL | Status: DC
  Administered 2019-10-10: 40 meq via ORAL
  Filled 2019-10-10 (×2): qty 2

## 2019-10-10 NOTE — Plan of Care (Signed)
  Problem: Education: Goal: Knowledge of risk factors and measures for prevention of condition will improve Outcome: Progressing   Problem: Coping: Goal: Psychosocial and spiritual needs will be supported Outcome: Progressing   Problem: Respiratory: Goal: Will maintain a patent airway Outcome: Progressing Goal: Complications related to the disease process, condition or treatment will be avoided or minimized Outcome: Progressing   

## 2019-10-10 NOTE — Progress Notes (Signed)
Contacted Son Leahanna Buser for updates. Questions and concerns were addressed. No other issues at this time.

## 2019-10-10 NOTE — Progress Notes (Signed)
Pt was ambulating in the hallway. No signs of distress  noted. Pt denies SOB.

## 2019-10-10 NOTE — Progress Notes (Signed)
PROGRESS NOTE  Heidi Schmidt [id number] DOB: [birth year] DOA: 10/07/2019  PCP: [facility]  Brief History/Interval Summary: 58 year old Caucasian female with past medical history of hypertension dyslipidemia was diagnosed with COVID-19 on December 12.  Presented to [facility] emergency department on December 14 and then on December 17.  She was discharged from the ED the second go around on oral dexamethasone and supplemental oxygen.  However her symptoms continue to get worse.  She presented back to the emergency department and had to be placed on 15 L of oxygen.  She was subsequently hospitalized for further management  Reason for Visit: Acute respiratory failure with hypoxia.  Pneumonia due to COVID-19.  Consultants: None  Procedures: None  Antibiotics: Anti-infectives (From admission, onward)   Start     Dose/Rate Route Frequency Ordered Stop   10/08/19 1000  remdesivir 100 mg in sodium chloride 0.9 % 100 mL IVPB  Status:  Discontinued     100 mg 200 mL/hr over 30 Minutes Intravenous Daily 10/07/19 0103 10/07/19 0106   10/07/19 1000  remdesivir 100 mg in sodium chloride 0.9 % 100 mL IVPB     100 mg 200 mL/hr over 30 Minutes Intravenous Daily 10/07/19 0141 10/10/19 0931   10/07/19 0115  remdesivir 200 mg in sodium chloride 0.9% 250 mL IVPB  Status:  Discontinued     200 mg 580 mL/hr over 30 Minutes Intravenous Once 10/07/19 0103 10/07/19 0106      Subjective/Interval History: Patient states that she is feeling better.  Less short of breath compared to yesterday.  Occasional cough.  Anxiety level has improved slightly.      Assessment/Plan:  Acute Hypoxic Resp. Failure/Pneumonia due to COVID-19   Recent Labs  Lab 10/07/19 0500 10/08/19 0045 10/09/19 0425  DDIMER 0.66* 0.93* 0.99*  FERRITIN 532* 472* 524*  CRP 4.9* 2.8* 4.8*  ALT 50* 44 50*    Objective findings: Fever: Remains afebrile Oxygen requirements: 4 L nasal cannula.  Saturating in  the early 90s.     COVID 19 Therapeutics: Antibacterials: None Remdesivir: Day 5 Steroids: Solu-Medrol 40 mg daily Diuretics: We will give 1 dose of Lasix today Actemra: Not given Convalescent Plasma: Not given Vitamin C and Zinc: Continue PUD Prophylaxis: Initiate Pepcid DVT Prophylaxis:  Lovenox  Patient seems to be stable from a respiratory standpoint.  Continue to wean down oxygen.  She will complete course of remdesivir today.  We will give her a dose of Lasix.  CRP had plateaued off.  Levels pending from this morning.  D-dimer 0.99 from yesterday.  Continue incentive spirometry, mobilization, out of bed to chair.  Likely need home oxygen.  Will benefit from outpatient pulmonology consultation.  Essential hypertension Does not take any blood pressure medications at home.  Elevated blood pressure likely due to anxiety.  Continue to monitor for now.     Dyslipidemia Not noted to be on statin at home.  Obesity Estimated body mass index is 30.21 kg/m as calculated from the following:   Height as of this encounter: 5' (1.524 m).   Weight as of this encounter: 70.2 kg.   DVT Prophylaxis: Lovenox Code Status: Full code Family Communication: Son updated daily Disposition Plan: Management as outlined above.  Hopefully home when improved.   Medications:  Scheduled: . vitamin C  500 mg Oral Daily  . chlorpheniramine-HYDROcodone  5 mL Oral Q12H  . enoxaparin (LOVENOX) injection  40 mg Subcutaneous Q24H  . famotidine  20 mg Oral Daily  .  feeding supplement (ENSURE ENLIVE)  237 mL Oral TID BM  . methylPREDNISolone (SOLU-MEDROL) injection  40 mg Intravenous Daily  . sodium chloride flush  10-40 mL Intracatheter Q12H  . zinc sulfate  220 mg Oral Daily   Continuous: . sodium chloride     RUE:AVWUJW chloride, acetaminophen, guaiFENesin-dextromethorphan, loratadine, ondansetron **OR** ondansetron (ZOFRAN) IV, polyvinyl alcohol, sodium chloride, sodium chloride flush,  traZODone   Objective:  Vital Signs  Vitals:   10/09/19 1559 10/09/19 2100 10/10/19 0500 10/10/19 0740  BP: (!) 140/0 (!) 148/62 132/89 (!) 156/97  Pulse: 81 (!) 103 (!) 108 92  Resp:  20 20 20   Temp: 98.5 F (36.9 C) 98.1 F (36.7 C) 98.4 F (36.9 C) 98.6 F (37 C)  TempSrc: Oral Oral Oral Oral  SpO2: 91% 96% 93% 94%  Weight:      Height:        Intake/Output Summary (Last 24 hours) at 10/10/2019 1313 Last data filed at 10/10/2019 0600 Gross per 24 hour  Intake 480 ml  Output --  Net 480 ml   Filed Weights   10/07/19 0037  Weight: 70.2 kg    General appearance: Awake alert.  In no distress.  Less anxious compared to yesterday Resp: Normal effort at rest.  Crackles at the bilateral bases no wheezing or rhonchi. Cardio: S1-S2 is normal regular.  No S3-S4.  No rubs murmurs or bruit GI: Abdomen is soft.  Nontender nondistended.  Bowel sounds are present normal.  No masses organomegaly Extremities: No edema.  Full range of motion of lower extremities. Neurologic: Alert and oriented x3.  No focal neurological deficits.      Lab Results:  Data Reviewed: I have personally reviewed following labs and imaging studies  CBC: Recent Labs  Lab 10/07/19 0500  WBC 5.7  HGB 12.8  HCT 38.2  MCV 89.9  PLT 390    Basic Metabolic Panel: Recent Labs  Lab 10/07/19 0500 10/08/19 0045 10/09/19 0425  NA 136 137 137  K 3.7 3.8 3.7  CL 101 101 100  CO2 23 26 26   GLUCOSE 142* 121* 92  BUN 14 20 17   CREATININE 0.69  0.69 0.88 0.72  CALCIUM 8.7* 8.9 8.8*    GFR: Estimated Creatinine Clearance: 67 mL/min (by C-G formula based on SCr of 0.72 mg/dL).  Liver Function Tests: Recent Labs  Lab 10/07/19 0500 10/08/19 0045 10/09/19 0425  AST 25 23 23   ALT 50* 44 50*  ALKPHOS 55 53 50  BILITOT 0.5 0.5 0.5  PROT 7.0 6.4* 6.3*  ALBUMIN 3.3* 3.0* 3.0*    Anemia Panel: Recent Labs    10/08/19 0045 10/09/19 0425  FERRITIN 472* 524*      Radiology  Studies: DG CHEST PORT 1 VIEW  Result Date: 10/10/2019 CLINICAL DATA:  COVID-19 positive.  Shortness of breath. EXAM: PORTABLE CHEST 1 VIEW COMPARISON:  10/07/2019 FINDINGS: Lungs are adequately inflated demonstrate patchy bilateral airspace opacification over the mid to lower lungs unchanged to slightly worse. No evidence of effusion. Cardiomediastinal silhouette and remainder of the exam is unchanged. IMPRESSION: Patchy bilateral airspace process with possible slight interval worsening likely multifocal pneumonia. Electronically Signed   By: 10/10/19 M.D.   On: 10/10/2019 08:37       LOS: 3 days   Heidi Schmidt 10/12/2019  [facility] Pager on [email or web address]  10/10/2019, 1:13 PM

## 2019-10-11 DIAGNOSIS — E875 Hyperkalemia: Secondary | ICD-10-CM

## 2019-10-11 LAB — CBC
HCT: 38.5 % (ref 36.0–46.0)
Hemoglobin: 12.5 g/dL (ref 12.0–15.0)
MCH: 29.8 pg (ref 26.0–34.0)
MCHC: 32.5 g/dL (ref 30.0–36.0)
MCV: 91.7 fL (ref 80.0–100.0)
Platelets: 403 10*3/uL — ABNORMAL HIGH (ref 150–400)
RBC: 4.2 MIL/uL (ref 3.87–5.11)
RDW: 13.1 % (ref 11.5–15.5)
WBC: 12.6 10*3/uL — ABNORMAL HIGH (ref 4.0–10.5)
nRBC: 0 % (ref 0.0–0.2)

## 2019-10-11 LAB — D-DIMER, QUANTITATIVE: D-Dimer, Quant: 0.43 ug/mL-FEU (ref 0.00–0.50)

## 2019-10-11 LAB — COMPREHENSIVE METABOLIC PANEL
ALT: 51 U/L — ABNORMAL HIGH (ref 0–44)
AST: 22 U/L (ref 15–41)
Albumin: 3.2 g/dL — ABNORMAL LOW (ref 3.5–5.0)
Alkaline Phosphatase: 57 U/L (ref 38–126)
Anion gap: 9 (ref 5–15)
BUN: 23 mg/dL — ABNORMAL HIGH (ref 6–20)
CO2: 27 mmol/L (ref 22–32)
Calcium: 9.9 mg/dL (ref 8.9–10.3)
Chloride: 99 mmol/L (ref 98–111)
Creatinine, Ser: 0.65 mg/dL (ref 0.44–1.00)
GFR calc Af Amer: >60 mL/min (ref >60)
GFR calc non Af Amer: >60 mL/min (ref >60)
Glucose, Bld: 112 mg/dL — ABNORMAL HIGH (ref 70–99)
Potassium: 5.5 mmol/L — ABNORMAL HIGH (ref 3.5–5.1)
Sodium: 135 mmol/L (ref 135–145)
Total Bilirubin: 0.7 mg/dL (ref 0.3–1.2)
Total Protein: 6.7 g/dL (ref 6.5–8.1)

## 2019-10-11 LAB — POTASSIUM: Potassium: 4.8 mmol/L (ref 3.5–5.1)

## 2019-10-11 LAB — URINALYSIS, ROUTINE W REFLEX MICROSCOPIC
Bilirubin Urine: NEGATIVE
Glucose, UA: NEGATIVE mg/dL
Hgb urine dipstick: NEGATIVE
Ketones, ur: NEGATIVE mg/dL
Leukocytes,Ua: NEGATIVE
Nitrite: NEGATIVE
Protein, ur: NEGATIVE mg/dL
Specific Gravity, Urine: 1.005 (ref 1.005–1.030)
pH: 7 (ref 5.0–8.0)

## 2019-10-11 LAB — FERRITIN: Ferritin: 448 ng/mL — ABNORMAL HIGH (ref 11–307)

## 2019-10-11 LAB — C-REACTIVE PROTEIN: CRP: 3.4 mg/dL — ABNORMAL HIGH (ref <1.0)

## 2019-10-11 MED ORDER — FUROSEMIDE 10 MG/ML IJ SOLN
40.0000 mg | Freq: Once | INTRAMUSCULAR | Status: AC
  Administered 2019-10-11: 40 mg via INTRAVENOUS
  Filled 2019-10-11: qty 4

## 2019-10-11 NOTE — Progress Notes (Signed)
PROGRESS NOTE  Heidi Schmidt [id number] DOB: 1961-09-18 DOA: 10/07/2019  PCP: [facility]  Brief History/Interval Summary: 58 year old Caucasian female with past medical history of hypertension dyslipidemia was diagnosed with COVID-19 on December 12.  [facility] department on December 14 and then on December 17.  She was discharged from the ED the second go around on oral dexamethasone and supplemental oxygen.  However her symptoms continue to get worse.  She presented back to the emergency department and had to be placed on 15 L of oxygen.  She was subsequently hospitalized for further management.  Reason for Visit: Acute respiratory failure with hypoxia.  Pneumonia due to COVID-19.  Consultants: None  Procedures: None  Antibiotics: Anti-infectives (From admission, onward)   Start     Dose/Rate Route Frequency Ordered Stop   10/08/19 1000  remdesivir 100 mg in sodium chloride 0.9 % 100 mL IVPB  Status:  Discontinued     100 mg 200 mL/hr over 30 Minutes Intravenous Daily 10/07/19 0103 10/07/19 0106   10/07/19 1000  remdesivir 100 mg in sodium chloride 0.9 % 100 mL IVPB     100 mg 200 mL/hr over 30 Minutes Intravenous Daily 10/07/19 0141 10/10/19 0931   10/07/19 0115  remdesivir 200 mg in sodium chloride 0.9% 250 mL IVPB  Status:  Discontinued     200 mg 580 mL/hr over 30 Minutes Intravenous Once 10/07/19 0103 10/07/19 0106      Subjective/Interval History: Patient mentions that she is feeling better.  Cough is improved.  Shortness of breath is improved.  She is walking in the hallway.    Assessment/Plan:  Acute Hypoxic Resp. Failure/Pneumonia due to COVID-19   Recent Labs  Lab 10/07/19 0500 10/08/19 0045 10/09/19 0425 10/10/19 1007 10/11/19 0200  DDIMER 0.66* 0.93* 0.99* 0.79* 0.43  FERRITIN 532* 472* 524* 585* 448*  CRP 4.9* 2.8* 4.8* 6.6* 3.4*  ALT 50* 44 50* 54* 51*    Objective findings: Fever: Remains  afebrile Oxygen requirements: 2 to 3 L of oxygen by nasal cannula.  Saturating in the early 90s.    COVID 19 Therapeutics: Antibacterials: None Remdesivir: Completed course yesterday Steroids: Solu-Medrol 40 mg daily Diuretics: Lasix given on 12/25.  Repeat on 12/26. Actemra: Not given Convalescent Plasma: Not given Vitamin C and Zinc: Continue PUD Prophylaxis: Initiate Pepcid DVT Prophylaxis:  Lovenox  Patient seems to be improving from a respiratory standpoint.  Her oxygen requirements have improved.  Lasix seems to have helped.  She completed a course of remdesivir.  She remains on steroids.  CRP has improved to 3.4 today.  D-dimer normal.  Continue incentive spirometry, mobilization, out of bed to chair.  Did desaturate into the 70s while ambulating.  She will need home oxygen.  Outpatient pulmonology consultation.  She is complaining of urinary frequency and burning sensation.  UA does not suggest infection.  Hyperkalemia Unclear reason for this.  She did get potassium supplements yesterday but it should not have raised her potassium level to such a degree.  We will recheck later today.  Essential hypertension Does not take any blood pressure medications at home.  Occasional high readings noted.  Possibly due to anxiety.  Continue to monitor for now.    Dyslipidemia Not noted to be on statin at home.  Obesity Estimated body mass index is 30.21 kg/m as calculated from the following:   Height as of this encounter: 5' (1.524 m).   Weight as of this encounter: 70.2 kg.  DVT Prophylaxis: Lovenox Code Status: Full code Family Communication: Son updated daily Disposition Plan: Hopefully discharge tomorrow.   Medications:  Scheduled: . vitamin C  500 mg Oral Daily  . chlorpheniramine-HYDROcodone  5 mL Oral Q12H  . enoxaparin (LOVENOX) injection  40 mg Subcutaneous Q24H  . famotidine  20 mg Oral Daily  . feeding supplement (ENSURE ENLIVE)  237 mL Oral TID BM  .  methylPREDNISolone (SOLU-MEDROL) injection  40 mg Intravenous Daily  . sodium chloride flush  10-40 mL Intracatheter Q12H  . zinc sulfate  220 mg Oral Daily   Continuous: . sodium chloride     RJJ:OACZYS chloride, acetaminophen, guaiFENesin-dextromethorphan, loratadine, ondansetron **OR** ondansetron (ZOFRAN) IV, polyvinyl alcohol, sodium chloride, sodium chloride flush, traZODone   Objective:  Vital Signs  Vitals:   10/10/19 2000 10/10/19 2200 10/11/19 0400 10/11/19 0904  BP: 139/90  139/77 (!) 146/77  Pulse: (!) 116 (!) 109 (!) 108 (!) 121  Resp: 18   19  Temp: 99.1 F (37.3 C)  97.6 F (36.4 C) 97.8 F (36.6 C)  TempSrc: Oral  Oral Oral  SpO2: 91%  92% 93%  Weight:      Height:        Intake/Output Summary (Last 24 hours) at 10/11/2019 1248 Last data filed at 10/10/2019 1400 Gross per 24 hour  Intake --  Output 1200 ml  Net -1200 ml   Filed Weights   10/07/19 0037  Weight: 70.2 kg    General appearance: Awake alert.  In no distress Resp: Improved air entry.  Normal effort at rest.  Few crackles at the bases.  No wheezing or rhonchi. Cardio: S1-S2 is normal regular.  No S3-S4.  No rubs murmurs or bruit GI: Abdomen is soft.  Nontender nondistended.  Bowel sounds are present normal.  No masses organomegaly Extremities: No edema.  Full range of motion of lower extremities. Neurologic: Alert and oriented x3.  No focal neurological deficits.     Lab Results:  Data Reviewed: I have personally reviewed following labs and imaging studies  CBC: Recent Labs  Lab 10/07/19 0500 10/10/19 1007 10/11/19 0200  WBC 5.7 10.4 12.6*  HGB 12.8 13.8 12.5  HCT 38.2 42.3 38.5  MCV 89.9 92.8 91.7  PLT 390 403* 403*    Basic Metabolic Panel: Recent Labs  Lab 10/07/19 0500 10/08/19 0045 10/09/19 0425 10/10/19 1007 10/11/19 0200  NA 136 137 137 139 135  K 3.7 3.8 3.7 3.4* 5.5*  CL 101 101 100 94* 99  CO2 23 26 26 30 27   GLUCOSE 142* 121* 92 133* 112*  BUN 14 20  17 17  23*  CREATININE 0.69  0.69 0.88 0.72 0.77 0.65  CALCIUM 8.7* 8.9 8.8* 9.2 9.9    GFR: Estimated Creatinine Clearance: 67 mL/min (by C-G formula based on SCr of 0.65 mg/dL).  Liver Function Tests: Recent Labs  Lab 10/07/19 0500 10/08/19 0045 10/09/19 0425 10/10/19 1007 10/11/19 0200  AST 25 23 23 25 22   ALT 50* 44 50* 54* 51*  ALKPHOS 55 53 50 59 57  BILITOT 0.5 0.5 0.5 0.9 0.7  PROT 7.0 6.4* 6.3* 7.6 6.7  ALBUMIN 3.3* 3.0* 3.0* 3.6 3.2*    Anemia Panel: Recent Labs    10/10/19 1007 10/11/19 0200  FERRITIN 585* 448*      Radiology Studies: DG CHEST PORT 1 VIEW  Result Date: 10/10/2019 CLINICAL DATA:  COVID-19 positive.  Shortness of breath. EXAM: PORTABLE CHEST 1 VIEW COMPARISON:  10/07/2019 FINDINGS: Lungs are adequately  inflated demonstrate patchy bilateral airspace opacification over the mid to lower lungs unchanged to slightly worse. No evidence of effusion. Cardiomediastinal silhouette and remainder of the exam is unchanged. IMPRESSION: Patchy bilateral airspace process with possible slight interval worsening likely multifocal pneumonia. Electronically Signed   By: Elberta Fortis M.D.   On: 10/10/2019 08:37       LOS: 4 days   Neima Lacross Rito Ehrlich  [facility] Pager on [email or web address]  10/11/2019, 12:48 PM

## 2019-10-11 NOTE — Plan of Care (Signed)
Plan of Care reviewed. 

## 2019-10-12 LAB — COMPREHENSIVE METABOLIC PANEL
ALT: 43 U/L (ref 0–44)
AST: 17 U/L (ref 15–41)
Albumin: 3.4 g/dL — ABNORMAL LOW (ref 3.5–5.0)
Alkaline Phosphatase: 51 U/L (ref 38–126)
Anion gap: 18 — ABNORMAL HIGH (ref 5–15)
BUN: 20 mg/dL (ref 6–20)
CO2: 27 mmol/L (ref 22–32)
Calcium: 8.4 mg/dL — ABNORMAL LOW (ref 8.9–10.3)
Chloride: 84 mmol/L — ABNORMAL LOW (ref 98–111)
Creatinine, Ser: 0.79 mg/dL (ref 0.44–1.00)
GFR calc Af Amer: >60 mL/min (ref >60)
GFR calc non Af Amer: >60 mL/min (ref >60)
Glucose, Bld: 96 mg/dL (ref 70–99)
Potassium: 4.4 mmol/L (ref 3.5–5.1)
Sodium: 129 mmol/L — ABNORMAL LOW (ref 135–145)
Total Bilirubin: 0.7 mg/dL (ref 0.3–1.2)
Total Protein: 6.9 g/dL (ref 6.5–8.1)

## 2019-10-12 LAB — CBC
HCT: 39.2 % (ref 36.0–46.0)
Hemoglobin: 13.2 g/dL (ref 12.0–15.0)
MCH: 30.7 pg (ref 26.0–34.0)
MCHC: 33.7 g/dL (ref 30.0–36.0)
MCV: 91.2 fL (ref 80.0–100.0)
Platelets: 330 10*3/uL (ref 150–400)
RBC: 4.3 MIL/uL (ref 3.87–5.11)
RDW: 13.2 % (ref 11.5–15.5)
WBC: 9.5 10*3/uL (ref 4.0–10.5)
nRBC: 0 % (ref 0.0–0.2)

## 2019-10-12 LAB — C-REACTIVE PROTEIN: CRP: 1.5 mg/dL — ABNORMAL HIGH (ref <1.0)

## 2019-10-12 MED ORDER — FAMOTIDINE 20 MG PO TABS: 20.0000 mg | ORAL_TABLET | Freq: Every day | ORAL | 0 refills | Status: DC

## 2019-10-12 MED ORDER — PREDNISONE 5 MG/5ML PO SOLN: ORAL | 0 refills | Status: DC

## 2019-10-12 NOTE — TOC Transition Note (Signed)
Transition of Care [facility]) - CM/SW Discharge Note   Patient Details  Name: Sidda Humm MRN: [id number] Date of Birth: Mar 27, 1961  [facility] of [facility] CM/SW Contact:  Geralynn Ochs, LCSW Phone Number: 10/12/2019, 2:29 PM   Clinical Narrative:   CSW spoke with patient and her son, Vicente Males, about setting up home oxygen. Per Vicente Males, patient had oxygen set up on a previous admission to [place] spoke with patient again and she couldn't remember who had provided the equipment, but that she had told them it wasn't needed so she didn't know if they were going to take it away from her or if she could keep it. Derek obtained name and number of provider of oxygen supplies, and CSW coordinated with [employer] to ensure that patient had the equipment that she needed and that she could still keep it. Shanon Brow indicated that the equipment was still what the patient needed and that he would make a note that she wanted it now so they wouldn't pick it back up. CSW told Vicente Males that the patient is able to discharge home now but he has to bring the portable tank with him when he comes to get her. No other needs identified at this time.    Final next level of care: Home/Self Care Barriers to Discharge: Barriers Resolved   Patient Goals and CMS Choice Patient states their goals for this hospitalization and ongoing recovery are:: to go home CMS Medicare.gov Compare Post Acute Care list provided to:: Patient Choice offered to / list presented to : Patient  Discharge Placement                Patient to be transferred to facility by: Family car Name of family member notified: Vicente Males Patient and family notified of of transfer: 10/12/19  Discharge Plan and Services                DME Arranged: Oxygen DME Agency: [employer] [facility] Date DME Agency Contacted: 10/12/19 Time DME Agency Contacted: [phone] Representative spoke with at DME Agency: [place] ([place])  Interventions     Readmission Risk Interventions No flowsheet data found.

## 2019-10-12 NOTE — Plan of Care (Signed)
Plan of Care reviewed. 

## 2019-10-12 NOTE — Progress Notes (Signed)
SATURATION QUALIFICATIONS: (This note is used to comply with regulatory documentation for home oxygen)  Patient Saturations on Room Air at Rest = 88%  Patient Saturations on Room Air while Ambulating = 86%  Patient Saturations on 4 Liters of oxygen while Ambulating = 91%  Please briefly explain why patient needs home oxygen: Pt is recovering from COVID-19 PNA, while her oxygen status has improved, she will need 02 support as she continues to recover at home.

## 2019-10-12 NOTE — Discharge Instructions (Signed)
COVID-19 COVID-19 is a respiratory infection that is caused by a virus called severe acute respiratory syndrome coronavirus 2 (SARS-CoV-2). The disease is also known as coronavirus disease or novel coronavirus. In some people, the virus may not cause any symptoms. In others, it may cause a serious infection. The infection can get worse quickly and can lead to complications, such as:  Pneumonia, or infection of the lungs.  Acute respiratory distress syndrome or ARDS. This is fluid build-up in the lungs.  Acute respiratory failure. This is a condition in which there is not enough oxygen passing from the lungs to the body.  Sepsis or septic shock. This is a serious bodily reaction to an infection.  Blood clotting problems.  Secondary infections due to bacteria or fungus. The virus that causes COVID-19 is contagious. This means that it can spread from person to person through droplets from coughs and sneezes (respiratory secretions). What are the causes? This illness is caused by a virus. You may catch the virus by:  Breathing in droplets from an infected person's cough or sneeze.  Touching something, like a table or a doorknob, that was exposed to the virus (contaminated) and then touching your mouth, nose, or eyes. What increases the risk? Risk for infection You are more likely to be infected with this virus if you:  Live in or travel to an area with a COVID-19 outbreak.  Come in contact with a sick person who recently traveled to an area with a COVID-19 outbreak.  Provide care for or live with a person who is infected with COVID-19. Risk for serious illness You are more likely to become seriously ill from the virus if you:  Are 58 years of age or older.  Have a long-term disease that lowers your body's ability to fight infection (immunocompromised).  Live in a nursing home or long-term care facility.  Have a long-term (chronic) disease such as: ? Chronic lung disease,  including chronic obstructive pulmonary disease or asthma ? Heart disease. ? Diabetes. ? Chronic kidney disease. ? Liver disease.  Are obese. What are the signs or symptoms? Symptoms of this condition can range from mild to severe. Symptoms may appear any time from 2 to 14 days after being exposed to the virus. They include:  A fever.  A cough.  Difficulty breathing.  Chills.  Muscle pains.  A sore throat.  Loss of taste or smell. Some people may also have stomach problems, such as nausea, vomiting, or diarrhea. Other people may not have any symptoms of COVID-19. How is this diagnosed? This condition may be diagnosed based on:  Your signs and symptoms, especially if: ? You live in an area with a COVID-19 outbreak. ? You recently traveled to or from an area where the virus is common. ? You provide care for or live with a person who was diagnosed with COVID-19.  A physical exam.  Lab tests, which may include: ? A nasal swab to take a sample of fluid from your nose. ? A throat swab to take a sample of fluid from your throat. ? A sample of mucus from your lungs (sputum). ? Blood tests.  Imaging tests, which may include, X-rays, CT scan, or ultrasound. How is this treated? At present, there is no medicine to treat COVID-19. Medicines that treat other diseases are being used on a trial basis to see if they are effective against COVID-19. Your health care provider will talk with you about ways to treat your symptoms. For  most people, the infection is mild and can be managed at home with rest, fluids, and over-the-counter medicines. Treatment for a serious infection usually takes places in a hospital intensive care unit (ICU). It may include one or more of the following treatments. These treatments are given until your symptoms improve.  Receiving fluids and medicines through an IV.  Supplemental oxygen. Extra oxygen is given through a tube in the nose, a face mask, or a  hood.  Positioning you to lie on your stomach (prone position). This makes it easier for oxygen to get into the lungs.  Continuous positive airway pressure (CPAP) or bi-level positive airway pressure (BPAP) machine. This treatment uses mild air pressure to keep the airways open. A tube that is connected to a motor delivers oxygen to the body.  Ventilator. This treatment moves air into and out of the lungs by using a tube that is placed in your windpipe.  Tracheostomy. This is a procedure to create a hole in the neck so that a breathing tube can be inserted.  Extracorporeal membrane oxygenation (ECMO). This procedure gives the lungs a chance to recover by taking over the functions of the heart and lungs. It supplies oxygen to the body and removes carbon dioxide. Follow these instructions at home: Lifestyle  If you are sick, stay home except to get medical care. Your health care provider will tell you how long to stay home. Call your health care provider before you go for medical care.  Rest at home as told by your health care provider.  Do not use any products that contain nicotine or tobacco, such as cigarettes, e-cigarettes, and chewing tobacco. If you need help quitting, ask your health care provider.  Return to your normal activities as told by your health care provider. Ask your health care provider what activities are safe for you. General instructions  Take over-the-counter and prescription medicines only as told by your health care provider.  Drink enough fluid to keep your urine pale yellow.  Keep all follow-up visits as told by your health care provider. This is important. How is this prevented?  There is no vaccine to help prevent COVID-19 infection. However, there are steps you can take to protect yourself and others from this virus. To protect yourself:   Do not travel to areas where COVID-19 is a risk. The areas where COVID-19 is reported change often. To identify  high-risk areas and travel restrictions, check the CDC travel website: FatFares.com.br  If you live in, or must travel to, an area where COVID-19 is a risk, take precautions to avoid infection. ? Stay away from people who are sick. ? Wash your hands often with soap and water for 20 seconds. If soap and water are not available, use an alcohol-based hand sanitizer. ? Avoid touching your mouth, face, eyes, or nose. ? Avoid going out in public, follow guidance from your state and local health authorities. ? If you must go out in public, wear a cloth face covering or face mask. ? Disinfect objects and surfaces that are frequently touched every day. This may include:  Counters and tables.  Doorknobs and light switches.  Sinks and faucets.  Electronics, such as phones, remote controls, keyboards, computers, and tablets. To protect others: If you have symptoms of COVID-19, take steps to prevent the virus from spreading to others.  If you think you have a COVID-19 infection, contact your health care provider right away. Tell your health care team that you think you  may have a COVID-19 infection.  Stay home. Leave your house only to seek medical care. Do not use public transport.  Do not travel while you are sick.  Wash your hands often with soap and water for 20 seconds. If soap and water are not available, use alcohol-based hand sanitizer.  Stay away from other members of your household. Let healthy household members care for children and pets, if possible. If you have to care for children or pets, wash your hands often and wear a mask. If possible, stay in your own room, separate from others. Use a different bathroom.  Make sure that all people in your household wash their hands well and often.  Cough or sneeze into a tissue or your sleeve or elbow. Do not cough or sneeze into your hand or into the air.  Wear a cloth face covering or face mask. Where to find more  information  Centers for Disease Control and Prevention: PurpleGadgets.be  World Health Organization: https://www.castaneda.info/ Contact a health care provider if:  You live in or have traveled to an area where COVID-19 is a risk and you have symptoms of the infection.  You have had contact with someone who has COVID-19 and you have symptoms of the infection. Get help right away if:  You have trouble breathing.  You have pain or pressure in your chest.  You have confusion.  You have bluish lips and fingernails.  You have difficulty waking from sleep.  You have symptoms that get worse. These symptoms may represent a serious problem that is an emergency. Do not wait to see if the symptoms will go away. Get medical help right away. Call your local emergency services (911 in the [place] Do not drive yourself to the hospital. Let the emergency medical personnel know if you think you have COVID-19. Summary  COVID-19 is a respiratory infection that is caused by a virus. It is also known as coronavirus disease or novel coronavirus. It can cause serious infections, such as pneumonia, acute respiratory distress syndrome, acute respiratory failure, or sepsis.  The virus that causes COVID-19 is contagious. This means that it can spread from person to person through droplets from coughs and sneezes.  You are more likely to develop a serious illness if you are 31 years of age or older, have a weak immunity, live in a nursing home, or have chronic disease.  There is no medicine to treat COVID-19. Your health care provider will talk with you about ways to treat your symptoms.  Take steps to protect yourself and others from infection. Wash your hands often and disinfect objects and surfaces that are frequently touched every day. Stay away from people who are sick and wear a mask if you are sick. This information is not intended to replace advice given to you by  your health care provider. Make sure you discuss any questions you have with your health care provider. Document Released: 11/07/2018 Document Revised: 02/27/2019 Document Reviewed: 11/07/2018 Elsevier Patient Education  2020 [place]: How to Protect Yourself and Others Know how it spreads  There is currently no vaccine to prevent coronavirus disease 2019 (COVID-19).  The best way to prevent illness is to avoid being exposed to this virus.  The virus is thought to spread mainly from person-to-person. ? Between people who are in close contact with one another (within about 6 feet). ? Through respiratory droplets produced when an infected person coughs, sneezes or talks. ? These droplets can land  in the mouths or noses of people who are nearby or possibly be inhaled into the lungs. ? Some recent studies have suggested that COVID-19 may be spread by people who are not showing symptoms. Everyone should Clean your hands often  Wash your hands often with soap and water for at least 20 seconds especially after you have been in a public place, or after blowing your nose, coughing, or sneezing.  If soap and water are not readily available, use a hand sanitizer that contains at least 60% alcohol. Cover all surfaces of your hands and rub them together until they feel dry.  Avoid touching your eyes, nose, and mouth with unwashed hands. Avoid close contact  Stay home if you are sick.  Avoid close contact with people who are sick.  Put distance between yourself and other people. ? Remember that some people without symptoms may be able to spread virus. ? This is especially important for people who are at higher risk of getting very GainPain.com.cy Cover your mouth and nose with a cloth face cover when around others  You could spread COVID-19 to others even if you do not feel sick.  Everyone should wear a  cloth face cover when they have to go out in public, for example to the grocery store or to pick up other necessities. ? Cloth face coverings should not be placed on young children under age 28, anyone who has trouble breathing, or is unconscious, incapacitated or otherwise unable to remove the mask without assistance.  The cloth face cover is meant to protect other people in case you are infected.  Do NOT use a facemask meant for a Dietitian.  Continue to keep about 6 feet between yourself and others. The cloth face cover is not a substitute for social distancing. Cover coughs and sneezes  If you are in a private setting and do not have on your cloth face covering, remember to always cover your mouth and nose with a tissue when you cough or sneeze or use the inside of your elbow.  Throw used tissues in the trash.  Immediately wash your hands with soap and water for at least 20 seconds. If soap and water are not readily available, clean your hands with a hand sanitizer that contains at least 60% alcohol. Clean and disinfect  Clean AND disinfect frequently touched surfaces daily. This includes tables, doorknobs, light switches, countertops, handles, desks, phones, keyboards, toilets, faucets, and sinks. RackRewards.fr  If surfaces are dirty, clean them: Use detergent or soap and water prior to disinfection.  Then, use a household disinfectant. You can see a list of EPA-registered household disinfectants here. [email or web address].com 02/18/2019 This information is not intended to replace advice given to you by your health care provider. Make sure you discuss any questions you have with your health care provider. Document Released: 01/28/2019 Document Revised: 02/26/2019 Document Reviewed: 01/28/2019 Elsevier Patient Education  2020 [employer].   COVID-19 Frequently Asked Questions COVID-19 (coronavirus disease) is  an infection that is caused by a large family of viruses. Some viruses cause illness in people and others cause illness in animals like camels, cats, and bats. In some cases, the viruses that cause illness in animals can spread to humans. Where did the coronavirus come from? In December 2019, [place] told the [facility]) of several cases of lung disease (human respiratory illness). These cases were linked to an open seafood and livestock market in the city of [place]. The link to the seafood  and livestock market suggests that the virus may have spread from animals to humans. However, since that first outbreak in December, the virus has also been shown to spread from person to person. What is the name of the disease and the virus? Disease name Early on, this disease was called novel coronavirus. This is because scientists determined that the disease was caused by a new (novel) respiratory virus. The World Health Organization [facility]) has now named the disease COVID-19, or coronavirus disease. Virus name The virus that causes the disease is called severe acute respiratory syndrome coronavirus 2 (SARS-CoV-2). More information on disease and virus naming World Health Organization [facility]): www.who.int/emergencies/diseases/novel-coronavirus-2019/technical-guidance/naming-the-coronavirus-disease-(covid-2019)-and-the-virus-that-causes-it Who is at risk for complications from coronavirus disease? Some people may be at higher risk for complications from coronavirus disease. This includes older adults and people who have chronic diseases, such as heart disease, diabetes, and lung disease. If you are at higher risk for complications, take these extra precautions:  Avoid close contact with people who are sick or have a fever or cough. Stay at least 3-6 ft (1-2 m) away from them, if possible.  Wash your hands often with soap and water for at least 20 seconds.  Avoid touching your face, mouth, nose, or  eyes.  Keep supplies on hand at home, such as food, medicine, and cleaning supplies.  Stay home as much as possible.  Avoid social gatherings and travel. How does coronavirus disease spread? The virus that causes coronavirus disease spreads easily from person to person (is contagious). There are also cases of community-spread disease. This means the disease has spread to:  People who have no known contact with other infected people.  People who have not traveled to areas where there are known cases. It appears to spread from one person to another through droplets from coughing or sneezing. Can I get the virus from touching surfaces or objects? There is still a lot that we do not know about the virus that causes coronavirus disease. Scientists are basing a lot of information on what they know about similar viruses, such as:  Viruses cannot generally survive on surfaces for long. They need a human body (host) to survive.  It is more likely that the virus is spread by close contact with people who are sick (direct contact), such as through: ? Shaking hands or hugging. ? Breathing in respiratory droplets that travel through the air. This can happen when an infected person coughs or sneezes on or near other people.  It is less likely that the virus is spread when a person touches a surface or object that has the virus on it (indirect contact). The virus may be able to enter the body if the person touches a surface or object and then touches his or her face, eyes, nose, or mouth. Can a person spread the virus without having symptoms of the disease? It may be possible for the virus to spread before a person has symptoms of the disease, but this is most likely not the main way the virus is spreading. It is more likely for the virus to spread by being in close contact with people who are sick and breathing in the respiratory droplets of a sick person's cough or sneeze. What are the symptoms of  coronavirus disease? Symptoms vary from person to person and can range from mild to severe. Symptoms may include:  Fever.  Cough.  Tiredness, weakness, or fatigue.  Fast breathing or feeling short of breath. These symptoms can appear anywhere  from 2 to 14 days after you have been exposed to the virus. If you develop symptoms, call your health care provider. People with severe symptoms may need hospital care. If I am exposed to the virus, how long does it take before symptoms start? Symptoms of coronavirus disease may appear anywhere from 2 to 14 days after a person has been exposed to the virus. If you develop symptoms, call your health care provider. Should I be tested for this virus? Your health care provider will decide whether to test you based on your symptoms, history of exposure, and your risk factors. How does a health care provider test for this virus? Health care providers will collect samples to send for testing. Samples may include:  Taking a swab of fluid from the nose.  Taking fluid from the lungs by having you cough up mucus (sputum) into a sterile cup.  Taking a blood sample.  Taking a stool or urine sample. Is there a treatment or vaccine for this virus? Currently, there is no vaccine to prevent coronavirus disease. Also, there are no medicines like antibiotics or antivirals to treat the virus. A person who becomes sick is given supportive care, which means rest and fluids. A person may also relieve his or her symptoms by using over-the-counter medicines that treat sneezing, coughing, and runny nose. These are the same medicines that a person takes for the common cold. If you develop symptoms, call your health care provider. People with severe symptoms may need hospital care. What can I do to protect myself and my family from this virus?     You can protect yourself and your family by taking the same actions that you would take to prevent the spread of other viruses.  Take the following actions:  Wash your hands often with soap and water for at least 20 seconds. If soap and water are not available, use alcohol-based hand sanitizer.  Avoid touching your face, mouth, nose, or eyes.  Cough or sneeze into a tissue, sleeve, or elbow. Do not cough or sneeze into your hand or the air. ? If you cough or sneeze into a tissue, throw it away immediately and wash your hands.  Disinfect objects and surfaces that you frequently touch every day.  Avoid close contact with people who are sick or have a fever or cough. Stay at least 3-6 ft (1-2 m) away from them, if possible.  Stay home if you are sick, except to get medical care. Call your health care provider before you get medical care.  Make sure your vaccines are up to date. Ask your health care provider what vaccines you need. What should I do if I need to travel? Follow travel recommendations from your local health authority, the CDC, and WHO. Travel information and advice  Centers for Disease Control and Prevention (CDC): BodyEditor.hu  World Health Organization [facility] ThirdIncome.ca Know the risks and take action to protect your health  You are at higher risk of getting coronavirus disease if you are traveling to areas with an outbreak or if you are exposed to travelers from areas with an outbreak.  Wash your hands often and practice good hygiene to lower the risk of catching or spreading the virus. What should I do if I am sick? General instructions to stop the spread of infection  Wash your hands often with soap and water for at least 20 seconds. If soap and water are not available, use alcohol-based hand sanitizer.  Cough or sneeze into  a tissue, sleeve, or elbow. Do not cough or sneeze into your hand or the air.  If you cough or sneeze into a tissue, throw it away immediately and wash your hands.  Stay  home unless you must get medical care. Call your health care provider or local health authority before you get medical care.  Avoid public areas. Do not take public transportation, if possible.  If you can, wear a mask if you must go out of the house or if you are in close contact with someone who is not sick. Keep your home clean  Disinfect objects and surfaces that are frequently touched every day. This may include: ? Counters and tables. ? Doorknobs and light switches. ? Sinks and faucets. ? Electronics such as phones, remote controls, keyboards, computers, and tablets.  Wash dishes in hot, soapy water or use a dishwasher. Air-dry your dishes.  Wash laundry in hot water. Prevent infecting other household members  Let healthy household members care for children and pets, if possible. If you have to care for children or pets, wash your hands often and wear a mask.  Sleep in a different bedroom or bed, if possible.  Do not share personal items, such as razors, toothbrushes, deodorant, combs, brushes, towels, and washcloths. Where to find more information Centers for Disease Control and Prevention (CDC)  Information and news updates: CardRetirement.czwww.cdc.gov/coronavirus/2019-ncov World Health Organization [facility](WHO)  Information and news updates: AffordableSalon.eswww.who.int/emergencies/diseases/novel-coronavirus-2019  Coronavirus health topic: https://thompson-craig.com/www.who.int/health-topics/coronavirus  Questions and answers on COVID-19: kruiseway.comwww.who.int/news-room/q-a-detail/q-a-coronaviruses  Global tracker: who.sprinklr.com American Academy of Pediatrics (AAP)  Information for families: www.healthychildren.org/English/health-issues/conditions/chest-lungs/Pages/2019-Novel-Coronavirus.aspx The coronavirus situation is changing rapidly. Check your local health authority website or the CDC and [facility] websites for updates and news. When should I contact a health care provider?  Contact your health care provider if you have symptoms of an  infection, such as fever or cough, and you: ? Have been near anyone who is known to have coronavirus disease. ? Have come into contact with a person who is suspected to have coronavirus disease. ? Have traveled outside of the country. When should I get emergency medical care?  Get help right away by calling your local emergency services (911 in the [place] if you have: ? Trouble breathing. ? Pain or pressure in your chest. ? Confusion. ? Blue-tinged lips and fingernails. ? Difficulty waking from sleep. ? Symptoms that get worse. Let the emergency medical personnel know if you think you have coronavirus disease. Summary  A new respiratory virus is spreading from person to person and causing COVID-19 (coronavirus disease).  The virus that causes COVID-19 appears to spread easily. It spreads from one person to another through droplets from coughing or sneezing.  Older adults and those with chronic diseases are at higher risk of disease. If you are at higher risk for complications, take extra precautions.  There is currently no vaccine to prevent coronavirus disease. There are no medicines, such as antibiotics or antivirals, to treat the virus.  You can protect yourself and your family by washing your hands often, avoiding touching your face, and covering your coughs and sneezes. This information is not intended to replace advice given to you by your health care provider. Make sure you discuss any questions you have with your health care provider. Document Released: 01/28/2019 Document Revised: 01/28/2019 Document Reviewed: 01/28/2019 Elsevier Patient Education  2020 [employer] COVID-19 COVID-19 is a respiratory infection that is caused by a virus called severe acute respiratory syndrome coronavirus 2 (SARS-CoV-2). The disease  is also known as coronavirus disease or novel coronavirus. In some people, the virus may not cause any symptoms. In others, it may cause a serious infection. The  infection can get worse quickly and can lead to complications, such as:  Pneumonia, or infection of the lungs.  Acute respiratory distress syndrome or ARDS. This is fluid build-up in the lungs.  Acute respiratory failure. This is a condition in which there is not enough oxygen passing from the lungs to the body.  Sepsis or septic shock. This is a serious bodily reaction to an infection.  Blood clotting problems.  Secondary infections due to bacteria or fungus. The virus that causes COVID-19 is contagious. This means that it can spread from person to person through droplets from coughs and sneezes (respiratory secretions). What are the causes? This illness is caused by a virus. You may catch the virus by:  Breathing in droplets from an infected person's cough or sneeze.  Touching something, like a table or a doorknob, that was exposed to the virus (contaminated) and then touching your mouth, nose, or eyes. What increases the risk? Risk for infection You are more likely to be infected with this virus if you:  Live in or travel to an area with a COVID-19 outbreak.  Come in contact with a sick person who recently traveled to an area with a COVID-19 outbreak.  Provide care for or live with a person who is infected with COVID-19. Risk for serious illness You are more likely to become seriously ill from the virus if you:  Are 7 years of age or older.  Have a long-term disease that lowers your body's ability to fight infection (immunocompromised).  Live in a nursing home or long-term care facility.  Have a long-term (chronic) disease such as: ? Chronic lung disease, including chronic obstructive pulmonary disease or asthma ? Heart disease. ? Diabetes. ? Chronic kidney disease. ? Liver disease.  Are obese. What are the signs or symptoms? Symptoms of this condition can range from mild to severe. Symptoms may appear any time from 2 to 14 days after being exposed to the virus.  They include:  A fever.  A cough.  Difficulty breathing.  Chills.  Muscle pains.  A sore throat.  Loss of taste or smell. Some people may also have stomach problems, such as nausea, vomiting, or diarrhea. Other people may not have any symptoms of COVID-19. How is this diagnosed? This condition may be diagnosed based on:  Your signs and symptoms, especially if: ? You live in an area with a COVID-19 outbreak. ? You recently traveled to or from an area where the virus is common. ? You provide care for or live with a person who was diagnosed with COVID-19.  A physical exam.  Lab tests, which may include: ? A nasal swab to take a sample of fluid from your nose. ? A throat swab to take a sample of fluid from your throat. ? A sample of mucus from your lungs (sputum). ? Blood tests.  Imaging tests, which may include, X-rays, CT scan, or ultrasound. How is this treated? At present, there is no medicine to treat COVID-19. Medicines that treat other diseases are being used on a trial basis to see if they are effective against COVID-19. Your health care provider will talk with you about ways to treat your symptoms. For most people, the infection is mild and can be managed at home with rest, fluids, and over-the-counter medicines. Treatment for a serious  infection usually takes places in a hospital intensive care unit (ICU). It may include one or more of the following treatments. These treatments are given until your symptoms improve.  Receiving fluids and medicines through an IV.  Supplemental oxygen. Extra oxygen is given through a tube in the nose, a face mask, or a hood.  Positioning you to lie on your stomach (prone position). This makes it easier for oxygen to get into the lungs.  Continuous positive airway pressure (CPAP) or bi-level positive airway pressure (BPAP) machine. This treatment uses mild air pressure to keep the airways open. A tube that is connected to a motor  delivers oxygen to the body.  Ventilator. This treatment moves air into and out of the lungs by using a tube that is placed in your windpipe.  Tracheostomy. This is a procedure to create a hole in the neck so that a breathing tube can be inserted.  Extracorporeal membrane oxygenation (ECMO). This procedure gives the lungs a chance to recover by taking over the functions of the heart and lungs. It supplies oxygen to the body and removes carbon dioxide. Follow these instructions at home: Lifestyle  If you are sick, stay home except to get medical care. Your health care provider will tell you how long to stay home. Call your health care provider before you go for medical care.  Rest at home as told by your health care provider.  Do not use any products that contain nicotine or tobacco, such as cigarettes, e-cigarettes, and chewing tobacco. If you need help quitting, ask your health care provider.  Return to your normal activities as told by your health care provider. Ask your health care provider what activities are safe for you. General instructions  Take over-the-counter and prescription medicines only as told by your health care provider.  Drink enough fluid to keep your urine pale yellow.  Keep all follow-up visits as told by your health care provider. This is important. How is this prevented?  There is no vaccine to help prevent COVID-19 infection. However, there are steps you can take to protect yourself and others from this virus. To protect yourself:   Do not travel to areas where COVID-19 is a risk. The areas where COVID-19 is reported change often. To identify high-risk areas and travel restrictions, check the CDC travel website: FatFares.com.br  If you live in, or must travel to, an area where COVID-19 is a risk, take precautions to avoid infection. ? Stay away from people who are sick. ? Wash your hands often with soap and water for 20 seconds. If soap and water  are not available, use an alcohol-based hand sanitizer. ? Avoid touching your mouth, face, eyes, or nose. ? Avoid going out in public, follow guidance from your state and local health authorities. ? If you must go out in public, wear a cloth face covering or face mask. ? Disinfect objects and surfaces that are frequently touched every day. This may include:  Counters and tables.  Doorknobs and light switches.  Sinks and faucets.  Electronics, such as phones, remote controls, keyboards, computers, and tablets. To protect others: If you have symptoms of COVID-19, take steps to prevent the virus from spreading to others.  If you think you have a COVID-19 infection, contact your health care provider right away. Tell your health care team that you think you may have a COVID-19 infection.  Stay home. Leave your house only to seek medical care. Do not use public transport.  Do not travel while you are sick.  Wash your hands often with soap and water for 20 seconds. If soap and water are not available, use alcohol-based hand sanitizer.  Stay away from other members of your household. Let healthy household members care for children and pets, if possible. If you have to care for children or pets, wash your hands often and wear a mask. If possible, stay in your own room, separate from others. Use a different bathroom.  Make sure that all people in your household wash their hands well and often.  Cough or sneeze into a tissue or your sleeve or elbow. Do not cough or sneeze into your hand or into the air.  Wear a cloth face covering or face mask. Where to find more information  Centers for Disease Control and Prevention: StickerEmporium.tn  World Health Organization: https://thompson-craig.com/ Contact a health care provider if:  You live in or have traveled to an area where COVID-19 is a risk and you have symptoms of the infection.  You have had contact  with someone who has COVID-19 and you have symptoms of the infection. Get help right away if:  You have trouble breathing.  You have pain or pressure in your chest.  You have confusion.  You have bluish lips and fingernails.  You have difficulty waking from sleep.  You have symptoms that get worse. These symptoms may represent a serious problem that is an emergency. Do not wait to see if the symptoms will go away. Get medical help right away. Call your local emergency services (911 in the [place] Do not drive yourself to the hospital. Let the emergency medical personnel know if you think you have COVID-19. Summary  COVID-19 is a respiratory infection that is caused by a virus. It is also known as coronavirus disease or novel coronavirus. It can cause serious infections, such as pneumonia, acute respiratory distress syndrome, acute respiratory failure, or sepsis.  The virus that causes COVID-19 is contagious. This means that it can spread from person to person through droplets from coughs and sneezes.  You are more likely to develop a serious illness if you are 53 years of age or older, have a weak immunity, live in a nursing home, or have chronic disease.  There is no medicine to treat COVID-19. Your health care provider will talk with you about ways to treat your symptoms.  Take steps to protect yourself and others from infection. Wash your hands often and disinfect objects and surfaces that are frequently touched every day. Stay away from people who are sick and wear a mask if you are sick. This information is not intended to replace advice given to you by your health care provider. Make sure you discuss any questions you have with your health care provider. Document Released: 11/07/2018 Document Revised: 02/27/2019 Document Reviewed: 11/07/2018 Elsevier Patient Education  2020 [employer].

## 2019-10-12 NOTE — Plan of Care (Signed)
Discharge

## 2019-10-12 NOTE — Discharge Summary (Signed)
[facility]  Physician Discharge Summary   Patient ID: Heidi Schmidt MRN: [id number] DOB/AGE: Jan 29, 1961 58 y.o.  Admit date: 10/07/2019 Discharge date: 10/12/2019  PCP: [facility]  DISCHARGE DIAGNOSES:  Acute respiratory failure with hypoxia, needs home oxygen Pneumonia due to COVID-19 Situational anxiety Essential hypertension   RECOMMENDATIONS FOR OUTPATIENT FOLLOW UP: 1. Home oxygen being ordered 2. Ambulatory referral to pulmonology 3. Check electrolytes in the outpatient setting at follow-up.    Home Health: None Equipment/Devices: Home oxygen  CODE STATUS: Full code  DISCHARGE CONDITION: fair  Diet recommendation: As before  INITIAL HISTORY: 58 year old Caucasian female with past medical history of hypertension dyslipidemia was diagnosed with COVID-19 on December 12.  Presented to [facility] emergency department on December 14 and then on December 17.  She was discharged from the ED the second go around on oral dexamethasone and supplemental oxygen.  However her symptoms continue to get worse.  She presented back to the emergency department and had to be placed on 15 L of oxygen.  She was subsequently hospitalized for further management.   HOSPITAL COURSE:   Acute Hypoxic Resp. Failure/Pneumonia due to COVID-19 Patient was hospitalized.  Placed on remdesivir and steroids.  She was given Lasix as well.  Her CRP started improving.  D-dimer was not that significantly elevated.  LFTs were minimally elevated.  She is doing better.  She is now down to 2 L of oxygen by nasal cannula.  She does tend to desaturate with ambulation.  She will need home oxygen.  Ambulatory referral to pulmonology has been ordered as well.  Situational anxiety Patient noted to be very anxious during the course of this hospitalization.  Likely reason for her mild tachycardia.  She was reassured.  She was also offered anxiolytic medications which she declined.     Electrolyte imbalances Noted to have elevated potassium level yesterday.  Was rechecked and was normal.  Noted to have hyponatremia and hypochloremia today.  Asymptomatic.  Likely erroneous labs versus an effect of diuretics.  She will not be discharged on diuretics.  Sodium level should recover.   Essential hypertension Does not take any blood pressure medications at home.  Occasional high readings noted.  Possibly due to anxiety.  Continue to monitor for now.    Dyslipidemia Not noted to be on statin at home.  Obesity Estimated body mass index is 30.21 kg/m as calculated from the following:   Height as of this encounter: 5' (1.524 m).   Weight as of this encounter: 70.2 kg.  Overall stable.  Okay for discharge home today.  Discussed with patient and her son.   PERTINENT LABS:  The results of significant diagnostics from this hospitalization (including imaging, microbiology, ancillary and laboratory) are listed below for reference.     Labs:  COVID-19 Labs  Recent Labs    10/10/19 1007 10/11/19 0200 10/12/19 0613  DDIMER 0.79* 0.43  --   FERRITIN 585* 448*  --   CRP 6.6* 3.4* 1.5*      Basic Metabolic Panel: Recent Labs  Lab 10/08/19 0045 10/09/19 0425 10/10/19 1007 10/11/19 0200 10/11/19 1330 10/12/19 0613  NA 137 137 139 135  --  129*  K 3.8 3.7 3.4* 5.5* 4.8 4.4  CL 101 100 94* 99  --  84*  CO2 26 26 30 27   --  27  GLUCOSE 121* 92 133* 112*  --  96  BUN 20 17 17  23*  --  20  CREATININE 0.88 0.72 0.77  0.65  --  0.79  CALCIUM 8.9 8.8* 9.2 9.9  --  8.4*   Liver Function Tests: Recent Labs  Lab 10/08/19 0045 10/09/19 0425 10/10/19 1007 10/11/19 0200 10/12/19 0613  AST 23 23 25 22 17   ALT 44 50* 54* 51* 43  ALKPHOS 53 50 59 57 51  BILITOT 0.5 0.5 0.9 0.7 0.7  PROT 6.4* 6.3* 7.6 6.7 6.9  ALBUMIN 3.0* 3.0* 3.6 3.2* 3.4*   CBC: Recent Labs  Lab 10/07/19 0500 10/10/19 1007 10/11/19 0200 10/12/19 0613  WBC 5.7 10.4 12.6* 9.5  HGB 12.8  13.8 12.5 13.2  HCT 38.2 42.3 38.5 39.2  MCV 89.9 92.8 91.7 91.2  PLT 390 403* 403* 330     IMAGING STUDIES DG Chest 1 View  Result Date: 10/07/2019 CLINICAL DATA:  Dyspnea.  COVID-19 infection. EXAM: CHEST  1 VIEW COMPARISON:  Radiographs 10/06/2019 and 10/02/2019. FINDINGS: 1024 hours. Slightly lower lung volumes. The heart size and mediastinal contours are stable. There are patchy ground-glass opacities in both lungs which are not significantly changed from the most recent study allowing for the lesser degree of inspiration on today's examination. There is no pleural effusion or pneumothorax. The bones appear unchanged. IMPRESSION: No significant change in patchy bilateral ground-glass opacities consistent with viral pneumonia since yesterday. No new findings demonstrated. Electronically Signed   By: Richardean Sale M.D.   On: 10/07/2019 10:31   DG CHEST PORT 1 VIEW  Result Date: 10/10/2019 CLINICAL DATA:  COVID-19 positive.  Shortness of breath. EXAM: PORTABLE CHEST 1 VIEW COMPARISON:  10/07/2019 FINDINGS: Lungs are adequately inflated demonstrate patchy bilateral airspace opacification over the mid to lower lungs unchanged to slightly worse. No evidence of effusion. Cardiomediastinal silhouette and remainder of the exam is unchanged. IMPRESSION: Patchy bilateral airspace process with possible slight interval worsening likely multifocal pneumonia. Electronically Signed   By: Marin Olp M.D.   On: 10/10/2019 08:37    DISCHARGE EXAMINATION: Vitals:   10/11/19 0904 10/11/19 1941 10/12/19 0450 10/12/19 0817  BP: (!) 146/77 (!) 150/94 (!) 158/94 120/82  Pulse: (!) 121 (!) 123 (!) 110 (!) 117  Resp: 19 18 18 16   Temp: 97.8 F (36.6 C) 98 F (36.7 C) 98 F (36.7 C) 99.2 F (37.3 C)  TempSrc: Oral Oral Oral Oral  SpO2: 93% 93% 94% 92%  Weight:      Height:       General appearance: Awake alert.  In no distress Resp: Improved air entry bilaterally.  Few crackles at the bases.  No  wheezing or rhonchi. Cardio: S1-S2 is normal regular.  No S3-S4.  No rubs murmurs or bruit GI: Abdomen is soft.  Nontender nondistended.  Bowel sounds are present normal.  No masses organomegaly Extremities: No edema.  Full range of motion of lower extremities. Neurologic: Alert and oriented x3.  No focal neurological deficits.    DISPOSITION: Home  Discharge Instructions    Ambulatory referral to Pulmonology   Complete by: As directed    Post COVID-19 follow-up in 3 to 4 weeks.  Is being discharged on home oxygen.   Call MD for:  difficulty breathing, headache or visual disturbances   Complete by: As directed    Call MD for:  extreme fatigue   Complete by: As directed    Call MD for:  persistant dizziness or light-headedness   Complete by: As directed    Call MD for:  persistant nausea and vomiting   Complete by: As directed  Call MD for:  severe uncontrolled pain   Complete by: As directed    Call MD for:  temperature >100.4   Complete by: As directed    Discharge instructions   Complete by: As directed    Take your medications as prescribed.  Follow-up with your primary care provider.  Use home oxygen.  COVID 19 INSTRUCTIONS  - You are felt to be stable enough to no longer require inpatient monitoring, testing, and treatment, though you will need to follow the recommendations below: - Based on the CDC's non-test criteria for ending self-isolation: You may not return to work/leave the home until at least 21 days since symptom onset AND 3 days without a fever (without taking tylenol, ibuprofen, etc.) AND have improvement in respiratory symptoms. - Do not take NSAID medications (including, but not limited to, ibuprofen, advil, motrin, naproxen, aleve, goody's powder, etc.) - Follow up with your doctor in the next week via telehealth or seek medical attention right away if your symptoms get WORSE.  - Consider donating plasma after you have recovered (either 14 days after a  negative test or 28 days after symptoms have completely resolved) because your antibodies to this virus may be helpful to give to others with life-threatening infections. Please go to the website [email or web address] if you would like to consider volunteering for plasma donation.    Directions for you at home:  Wear a facemask You should wear a facemask that covers your nose and mouth when you are in the same room with other people and when you visit a healthcare provider. People who live with or visit you should also wear a facemask while they are in the same room with you.  Separate yourself from other people in your home As much as possible, you should stay in a different room from other people in your home. Also, you should use a separate bathroom, if available.  Avoid sharing household items You should not share dishes, drinking glasses, cups, eating utensils, towels, bedding, or other items with other people in your home. After using these items, you should wash them thoroughly with soap and water.  Cover your coughs and sneezes Cover your mouth and nose with a tissue when you cough or sneeze, or you can cough or sneeze into your sleeve. Throw used tissues in a lined trash can, and immediately wash your hands with soap and water for at least 20 seconds or use an alcohol-based hand rub.  Wash your [employer] your hands often and thoroughly with soap and water for at least 20 seconds. You can use an alcohol-based hand sanitizer if soap and water are not available and if your hands are not visibly dirty. Avoid touching your eyes, nose, and mouth with unwashed hands.  Directions for those who live with, or provide care at home for you:  Limit the number of people who have contact with the patient If possible, have only one caregiver for the patient. Other household members should stay in another home or place of residence. If this is not possible, they should stay in another room, or be  separated from the patient as much as possible. Use a separate bathroom, if available. Restrict visitors who do not have an essential need to be in the home.  Ensure good ventilation Make sure that shared spaces in the home have good air flow, such as from an air conditioner or an opened window, weather permitting.  Wash your hands often Wash your hands often and  thoroughly with soap and water for at least 20 seconds. You can use an alcohol based hand sanitizer if soap and water are not available and if your hands are not visibly dirty. Avoid touching your eyes, nose, and mouth with unwashed hands. Use disposable paper towels to dry your hands. If not available, use dedicated cloth towels and replace them when they become wet.  Wear a facemask and gloves Wear a disposable facemask at all times in the room and gloves when you touch or have contact with the patients blood, body fluids, and/or secretions or excretions, such as sweat, saliva, sputum, nasal mucus, vomit, urine, or feces.  Ensure the mask fits over your nose and mouth tightly, and do not touch it during use. Throw out disposable facemasks and gloves after using them. Do not reuse. Wash your hands immediately after removing your facemask and gloves. If your personal clothing becomes contaminated, carefully remove clothing and launder. Wash your hands after handling contaminated clothing. Place all used disposable facemasks, gloves, and other waste in a lined container before disposing them with other household waste. Remove gloves and wash your hands immediately after handling these items.  Do not share dishes, glasses, or other household items with the patient Avoid sharing household items. You should not share dishes, drinking glasses, cups, eating utensils, towels, bedding, or other items with a patient who is confirmed to have, or being evaluated for, COVID-19 infection. After the person uses these items, you should wash them  thoroughly with soap and water.  Wash laundry thoroughly Immediately remove and wash clothes or bedding that have blood, body fluids, and/or secretions or excretions, such as sweat, saliva, sputum, nasal mucus, vomit, urine, or feces, on them. Wear gloves when handling laundry from the patient. Read and follow directions on labels of laundry or clothing items and detergent. In general, wash and dry with the warmest temperatures recommended on the label.  Clean all areas the individual has used often Clean all touchable surfaces, such as counters, tabletops, doorknobs, bathroom fixtures, toilets, phones, keyboards, tablets, and bedside tables, every day. Also, clean any surfaces that may have blood, body fluids, and/or secretions or excretions on them. Wear gloves when cleaning surfaces the patient has come in contact with. Use a diluted bleach solution (e.g., dilute bleach with 1 part bleach and 10 parts water) or a household disinfectant with a label that says EPA-registered for coronaviruses. To make a bleach solution at home, add 1 tablespoon of bleach to 1 quart (4 cups) of water. For a larger supply, add  cup of bleach to 1 gallon (16 cups) of water. Read labels of cleaning products and follow recommendations provided on product labels. Labels contain instructions for safe and effective use of the cleaning product including precautions you should take when applying the product, such as wearing gloves or eye protection and making sure you have good ventilation during use of the product. Remove gloves and wash hands immediately after cleaning.  Monitor yourself for signs and symptoms of illness Caregivers and household members are considered close contacts, should monitor their health, and will be asked to limit movement outside of the home to the extent possible. Follow the monitoring steps for close contacts listed on the symptom monitoring form.   If you have additional questions, contact  your local health department or call the epidemiologist on call at [phone] (available 24/7). This guidance is subject to change. For the most up-to-date guidance from [facility], please refer to their website: TripMetro.huhttps://www.cdc.gov/coronavirus/2019-ncov/hcp/guidance-prevent-spread.html  You were cared for by a hospitalist during your hospital stay. If you have any questions about your discharge medications or the care you received while you were in the hospital after you are discharged, you can call the unit and asked to speak with the hospitalist on call if the hospitalist that took care of you is not available. Once you are discharged, your primary care physician will handle any further medical issues. Please note that NO REFILLS for any discharge medications will be authorized once you are discharged, as it is imperative that you return to your primary care physician (or establish a relationship with a primary care physician if you do not have one) for your aftercare needs so that they can reassess your need for medications and monitor your lab values. If you do not have a primary care physician, you can call [phone] for a physician referral.   Increase activity slowly   Complete by: As directed    MyChart COVID-19 home monitoring program   Complete by: Oct 12, 2019    Is the patient willing to use the MyChart Mobile App for home monitoring?: Yes   Temperature monitoring   Complete by: Oct 12, 2019    After how many days would you like to receive a notification of this patient's flowsheet entries?: 1       Allergies as of 10/12/2019      Reactions   Dexamethasone Other (See Comments)   facial flushing   Cephalexin Rash   Penicillins Rash   Did it involve swelling of the face/tongue/throat, SOB, or low BP? No Did it involve sudden or severe rash/hives, skin peeling, or any reaction on the inside of your mouth or nose? Unknown Did you need to seek medical attention at a hospital or doctor's  office? Yes When did it last happen?childhood If all above answers are NO, may proceed with cephalosporin use.   Sulfa Antibiotics Rash      Medication List    STOP taking these medications   cefpodoxime 200 MG tablet Commonly known as: VANTIN   rivaroxaban 10 MG Tabs tablet Commonly known as: XARELTO     TAKE these medications   cetirizine 10 MG tablet Commonly known as: ZYRTEC Take 10 mg by mouth 2 (two) times daily as needed for allergies.   famotidine 20 MG tablet Commonly known as: PEPCID Take 1 tablet (20 mg total) by mouth daily for 10 days. Start taking on: October 13, 2019 What changed:   medication strength  how much to take  when to take this  reasons to take this   glucosamine-chondroitin 500-400 MG tablet Take 1 tablet by mouth daily.   multivitamin capsule Take 1 capsule by mouth daily.   predniSONE 5 MG/5ML solution Take  (20ml) daily for 3 days, then take  (10ml) daily for 3 days, then STOP.            Durable Medical Equipment  (From admission, onward)         Start     Ordered   10/12/19 1106  For home use only DME oxygen  Once    Question Answer Comment  Length of Need 6 Months   Mode or (Route) Nasal cannula   Liters per Minute 3   Frequency Continuous (stationary and portable oxygen unit needed)   Oxygen conserving device Yes   Oxygen delivery system Gas      10/12/19 1105           Follow-up  Information    [facility]. Schedule an appointment as soon as possible for a visit in 1 week(s).   Contact information: [address] [facility] [address] [place] [address] [phone]           TOTAL DISCHARGE TIME: 35 minutes  Ayiden Milliman Rito Ehrlich  [facility] Pager on [email or web address]  10/12/2019, 2:30 PM

## 2019-10-19 DIAGNOSIS — E871 Hypo-osmolality and hyponatremia: Secondary | ICD-10-CM | POA: Diagnosis not present

## 2019-10-19 DIAGNOSIS — F418 Other specified anxiety disorders: Secondary | ICD-10-CM

## 2019-10-19 DIAGNOSIS — U071 COVID-19: Secondary | ICD-10-CM | POA: Diagnosis not present

## 2019-10-20 DIAGNOSIS — F418 Other specified anxiety disorders: Secondary | ICD-10-CM | POA: Diagnosis not present

## 2019-10-20 DIAGNOSIS — U071 COVID-19: Secondary | ICD-10-CM | POA: Diagnosis not present

## 2019-10-20 DIAGNOSIS — E871 Hypo-osmolality and hyponatremia: Secondary | ICD-10-CM | POA: Diagnosis not present

## 2019-10-21 DIAGNOSIS — F418 Other specified anxiety disorders: Secondary | ICD-10-CM | POA: Diagnosis not present

## 2019-10-21 DIAGNOSIS — E871 Hypo-osmolality and hyponatremia: Secondary | ICD-10-CM | POA: Diagnosis not present

## 2019-10-21 DIAGNOSIS — U071 COVID-19: Secondary | ICD-10-CM | POA: Diagnosis not present

## 2019-10-22 DIAGNOSIS — U071 COVID-19: Secondary | ICD-10-CM | POA: Diagnosis not present

## 2019-10-22 DIAGNOSIS — E871 Hypo-osmolality and hyponatremia: Secondary | ICD-10-CM | POA: Diagnosis not present

## 2019-10-22 DIAGNOSIS — F418 Other specified anxiety disorders: Secondary | ICD-10-CM | POA: Diagnosis not present

## 2019-10-23 DIAGNOSIS — E871 Hypo-osmolality and hyponatremia: Secondary | ICD-10-CM | POA: Diagnosis not present

## 2019-10-23 DIAGNOSIS — U071 COVID-19: Secondary | ICD-10-CM | POA: Diagnosis not present

## 2019-10-23 DIAGNOSIS — F418 Other specified anxiety disorders: Secondary | ICD-10-CM | POA: Diagnosis not present

## 2019-10-24 DIAGNOSIS — U071 COVID-19: Secondary | ICD-10-CM | POA: Diagnosis not present

## 2019-10-24 DIAGNOSIS — F418 Other specified anxiety disorders: Secondary | ICD-10-CM | POA: Diagnosis not present

## 2019-10-24 DIAGNOSIS — E871 Hypo-osmolality and hyponatremia: Secondary | ICD-10-CM | POA: Diagnosis not present

## 2019-11-06 ENCOUNTER — Other Ambulatory Visit: Payer: Self-pay

## 2019-11-11 ENCOUNTER — Encounter: Payer: Self-pay | Admitting: Family Medicine

## 2019-11-13 ENCOUNTER — Ambulatory Visit: Payer: 59 | Admitting: Pulmonary Disease

## 2019-11-13 ENCOUNTER — Encounter: Payer: Self-pay | Admitting: Pulmonary Disease

## 2019-11-13 ENCOUNTER — Ambulatory Visit (INDEPENDENT_AMBULATORY_CARE_PROVIDER_SITE_OTHER): Payer: 59

## 2019-11-13 ENCOUNTER — Other Ambulatory Visit: Payer: Self-pay

## 2019-11-13 VITALS — BP 116/60 | HR 106 | Temp 97.3°F | Ht 61.0 in | Wt 140.8 lb

## 2019-11-13 DIAGNOSIS — U071 COVID-19: Secondary | ICD-10-CM

## 2019-11-13 DIAGNOSIS — J9600 Acute respiratory failure, unspecified whether with hypoxia or hypercapnia: Secondary | ICD-10-CM

## 2019-11-13 NOTE — Patient Instructions (Signed)
We will get a chest x-ray today to reevaluate the lungs Patient with facial he may need to do a CT scan of the chest and lung function test Please have your primary care doctor send [place] a copy of the overnight oxygen test for our record Follow-up in 1 to 2 months

## 2019-11-13 NOTE — Progress Notes (Signed)
Heidi Schmidt    [id number]    Aug 26, 1961  Primary Care Physician:[facility]  Referring Physician: Bonnielee Haff, MD [address] [place],  [place] [address]  Chief complaint: Consult for post COVID-19 pneumonia  HPI: 59 year old Caucasian female with past medical history of hypertension dyslipidemia was diagnosed with COVID-19 on December 12.  Hospitalized from 12/22-12/27/20.  Treated with steroids, remdesivir supplemental oxygen.  Discharge she was hospitalized again at [facility] for hyponatremia with x-ray showing bilateral infiltrates.  Reviewed records, imaging and labs from all hospitalization She also underwent a neurologic evaluation for anxiety with negative MRI, CT scan  At home she took herself off supplemental oxygen but was put back on nocturnal oxygen after it was noted that she had sats in the 80s on waking up.  She recently had an overnight oximetry ordered by her primary care this week and results are pending.  States that breathing is improving back to baseline.  Denies any cough, sputum production, fevers, chills  Pets: No pets Occupation: Works as a data entry person for a factory Exposures: No known exposures.  No mold, hot tub, Jacuzzi Smoking history: Never smoker Travel history: No significant travel Relevant family history: No significant family history of lung  Outpatient Encounter Medications as of 11/13/2019  Medication Sig  . Bioflavonoid Products (VITAMIN C PLUS PO) Take 500 mg by mouth daily.  . cetirizine (ZYRTEC) 10 MG tablet Take 10 mg by mouth 2 (two) times daily as needed for allergies.   . clonazePAM (KLONOPIN) 0.5 MG tablet Take 0.5 mg by mouth 3 (three) times daily as needed.   . cyclobenzaprine (FLEXERIL) 5 MG tablet Take 5 mg by mouth at bedtime as needed.  . Multiple Vitamin (MULTIVITAMIN) capsule Take 1 capsule by mouth daily.  . sertraline (ZOLOFT) 50 MG tablet Take 50 mg by mouth daily.  . [DISCONTINUED]  famotidine (PEPCID) 20 MG tablet Take 1 tablet (20 mg total) by mouth daily for 10 days.  . [DISCONTINUED] glucosamine-chondroitin 500-400 MG tablet Take 1 tablet by mouth daily.   . [DISCONTINUED] predniSONE 5 MG/5ML solution Take 20mg  (76ml) daily for 3 days, then take 10mg  (3ml) daily for 3 days, then STOP.   No facility-administered encounter medications on file as of 11/13/2019.    Allergies as of 11/13/2019 - Review Complete 11/13/2019  Allergen Reaction Noted  . Dexamethasone Other (See Comments) 10/07/2019  . Cephalexin Rash 10/07/2019  . Penicillins Rash 10/07/2019  . Sulfa antibiotics Rash 10/07/2019    Past Medical History:  Diagnosis Date  . Anemia   . Arthritis   . Depression   . HLD (hyperlipidemia)   . Hypertension     Past Surgical History:  Procedure Laterality Date  . CESAREAN SECTION      Family History  Problem Relation Age of Onset  . Hypertension Mother   . Diabetes Mother   . Cancer Mother   . Heart disease Mother   . Diabetes Brother   . Diabetes Maternal Grandfather     Social History   Socioeconomic History  . Marital status: Single    Spouse name: Not on file  . Number of children: Not on file  . Years of education: Not on file  . Highest education level: Not on file  Occupational History  . Not on file  Tobacco Use  . Smoking status: Never Smoker  . Smokeless tobacco: Never Used  Substance and Sexual Activity  . Alcohol  use: Not Currently  . Drug use: Never  . Sexual activity: Not on file  Other Topics Concern  . Not on file  Social History Narrative  . Not on file   Social Determinants of Health   Financial Resource Strain:   . Difficulty of Paying Living Expenses: Not on file  Food Insecurity:   . Worried About Programme researcher, broadcasting/film/video in the Last Year: Not on file  . Ran Out of Food in the Last Year: Not on file  Transportation Needs:   . Lack of Transportation (Medical): Not on file  . Lack of Transportation  (Non-Medical): Not on file  Physical Activity:   . Days of Exercise per Week: Not on file  . Minutes of Exercise per Session: Not on file  Stress:   . Feeling of Stress : Not on file  Social Connections:   . Frequency of Communication with Friends and Family: Not on file  . Frequency of Social Gatherings with Friends and Family: Not on file  . Attends Religious Services: Not on file  . Active Member of Clubs or Organizations: Not on file  . Attends Banker Meetings: Not on file  . Marital Status: Not on file  Intimate Partner Violence:   . Fear of Current or Ex-Partner: Not on file  . Emotionally Abused: Not on file  . Physically Abused: Not on file  . Sexually Abused: Not on file    Review of systems: Review of Systems  Constitutional: Negative for fever and chills.  HENT: Negative.   Eyes: Negative for blurred vision.  Respiratory: as per HPI  Cardiovascular: Negative for chest pain and palpitations.  Gastrointestinal: Negative for vomiting, diarrhea, blood per rectum. Genitourinary: Negative for dysuria, urgency, frequency and hematuria.  Musculoskeletal: Negative for myalgias, back pain and joint pain.  Skin: Negative for itching and rash.  Neurological: Negative for dizziness, tremors, focal weakness, seizures and loss of consciousness.  Endo/Heme/Allergies: Negative for environmental allergies.  Psychiatric/Behavioral: Negative for depression, suicidal ideas and hallucinations.  All other systems reviewed and are negative.  Physical Exam: Blood pressure 116/60, pulse (!) 106, temperature (!) 97.3 F (36.3 C), temperature source Temporal, height 5\' 1"  (1.549 m), weight 140 lb 12.8 oz (63.9 kg), SpO2 94 %. Gen:      No acute distress HEENT:  EOMI, sclera anicteric Neck:     No masses; no thyromegaly Lungs:    Clear to auscultation bilaterally; normal respiratory effort CV:         Regular rate and rhythm; no murmurs Abd:      + bowel sounds; soft,  non-tender; no palpable masses, no distension Ext:    No edema; adequate peripheral perfusion Skin:      Warm and dry; no rash Neuro: alert and oriented x 3 Psych: normal mood and affect  Data Reviewed: Imaging: Chest x-ray 12/22-patchy bilateral airspace disease Chest x-ray 10/19/2018-mild worsening of interstitial opacities Reviewed the images personally  MRI head 1/6-normal CT head 12/3-normal  Assessment:  Post COVID-19 pneumonia Clinically she appears to be close to her baseline Last chest x-ray did show some worsening of opacities to it was done soon after her discharge We will repeat a chest x-ray today.  If abnormal then schedule pulmonary function test and high-res CT I have asked her to send the results of her overnight oximetry from her primary care for our records.   Plan/Recommendations: - Chest x-ray - Continue supplemental oxygen at night.   12/18/2018 MD  [facility] 11/13/2019, 10:25 AM  CC: Osvaldo Shipper, MD

## 2019-11-14 ENCOUNTER — Telehealth: Payer: Self-pay

## 2019-11-14 DIAGNOSIS — U071 COVID-19: Secondary | ICD-10-CM

## 2019-11-14 NOTE — Telephone Encounter (Signed)
I called and spoke with the patient and made her aware of her results. She verbalized understanding. I have placed an order for a HRCT.

## 2019-11-14 NOTE — Telephone Encounter (Signed)
-----   Message from Chilton Greathouse, MD sent at 11/14/2019  9:46 AM EST ----- Chest x-ray still shows persistent abnormalities.  Please order HRCT

## 2019-11-18 ENCOUNTER — Ambulatory Visit (INDEPENDENT_AMBULATORY_CARE_PROVIDER_SITE_OTHER): Payer: 59 | Admitting: Legal Medicine

## 2019-11-18 DIAGNOSIS — E871 Hypo-osmolality and hyponatremia: Secondary | ICD-10-CM

## 2019-11-18 NOTE — Addendum Note (Signed)
Addended by: Judd Gaudier on: 11/18/2019 10:53 AM   Modules accepted: Orders

## 2019-11-19 ENCOUNTER — Telehealth: Payer: Self-pay

## 2019-11-19 LAB — COMPREHENSIVE METABOLIC PANEL WITH GFR
ALT: 53 IU/L — ABNORMAL HIGH (ref 0–32)
AST: 28 IU/L (ref 0–40)
Albumin/Globulin Ratio: 2.5 — ABNORMAL HIGH (ref 1.2–2.2)
Albumin: 4.5 g/dL (ref 3.8–4.9)
Alkaline Phosphatase: 47 IU/L (ref 39–117)
BUN/Creatinine Ratio: 7 — ABNORMAL LOW (ref 9–23)
BUN: 5 mg/dL — ABNORMAL LOW (ref 6–24)
Bilirubin Total: 0.3 mg/dL (ref 0.0–1.2)
CO2: 24 mmol/L (ref 20–29)
Calcium: 9.7 mg/dL (ref 8.7–10.2)
Chloride: 103 mmol/L (ref 96–106)
Creatinine, Ser: 0.74 mg/dL (ref 0.57–1.00)
GFR calc Af Amer: 103 mL/min/1.73
GFR calc non Af Amer: 90 mL/min/1.73
Globulin, Total: 1.8 g/dL (ref 1.5–4.5)
Glucose: 104 mg/dL — ABNORMAL HIGH (ref 65–99)
Potassium: 4.2 mmol/L (ref 3.5–5.2)
Sodium: 142 mmol/L (ref 134–144)
Total Protein: 6.3 g/dL (ref 6.0–8.5)

## 2019-11-19 NOTE — Telephone Encounter (Signed)
Patient was informed.

## 2019-11-27 ENCOUNTER — Other Ambulatory Visit: Payer: Self-pay | Admitting: Family Medicine

## 2019-11-27 ENCOUNTER — Other Ambulatory Visit: Payer: Self-pay

## 2019-11-27 ENCOUNTER — Ambulatory Visit (INDEPENDENT_AMBULATORY_CARE_PROVIDER_SITE_OTHER): Payer: 59 | Admitting: Family Medicine

## 2019-11-27 VITALS — BP 108/60 | HR 88 | Temp 99.4°F | Resp 16 | Ht 60.0 in | Wt 138.6 lb

## 2019-11-27 DIAGNOSIS — M545 Low back pain, unspecified: Secondary | ICD-10-CM

## 2019-11-27 DIAGNOSIS — F411 Generalized anxiety disorder: Secondary | ICD-10-CM | POA: Diagnosis not present

## 2019-11-27 DIAGNOSIS — G4734 Idiopathic sleep related nonobstructive alveolar hypoventilation: Secondary | ICD-10-CM | POA: Diagnosis not present

## 2019-11-27 DIAGNOSIS — F5102 Adjustment insomnia: Secondary | ICD-10-CM

## 2019-11-27 DIAGNOSIS — B86 Scabies: Secondary | ICD-10-CM | POA: Insufficient documentation

## 2019-11-27 MED ORDER — SERTRALINE HCL 50 MG PO TABS: 50.0000 mg | ORAL_TABLET | Freq: Every day | ORAL | 3 refills | Status: DC

## 2019-11-27 MED ORDER — PERMETHRIN 0.5 % AERO: INHALATION_SPRAY | Freq: Once | 0 refills | Status: AC

## 2019-11-27 MED ORDER — PERMETHRIN 5 % EX CREA: TOPICAL_CREAM | Freq: Once | CUTANEOUS | Status: DC

## 2019-11-27 NOTE — Progress Notes (Signed)
Subjective:  Patient ID: Heidi Schmidt, female    DOB: 12/17/1960  Age: 59 y.o. MRN: [id number]  Chief Complaint  Patient presents with  . Anxiety  . Insomnia  . Back Pain    HPI: Anxiety: Patient complains of anxiety disorder.  She has the following symptoms: difficulty concentrating, fatigue, insomnia. Onset of symptoms was approximately 6 weeks ago, gradually improving since that time. She denies current suicidal and homicidal ideation. Family history significant for no psychiatric illness.Possible organic causes contributing are: none.. Risk factors: none  Current treatment includes zoloft and clonazepam.  She complains of the following side effects from the treatment: none.  Insomnia has improved.  Patient is currently taking melatonin 3 mg 1 at night.  She is unsure if this is helping as well. Lumbar pain significantly improved.  She used stretching exercises.  She did try the Flexeril I gave her but is unsure if it may it better.  It did make her feel wobbly so she took no more.  Patient also underwent an overnight oximetry test which indicated she needs Oxygen still at night. This was started when in the hospital for COVID-19.  She was discharged on 2 L of oxygen.  She is currently only using it at nighttime.  Her home oxygen pulsometer shows her oxygen to stay above 90 %. It drops at night time. Patient is seeing pulmonology.     Social Hx   Social History   Socioeconomic History  . Marital status: Single    Spouse name: Not on file  . Number of children: Not on file  . Years of education: Not on file  . Highest education level: Not on file  Occupational History  . Not on file  Tobacco Use  . Smoking status: Never Smoker  . Smokeless tobacco: Never Used  Substance and Sexual Activity  . Alcohol use: Not Currently  . Drug use: Never  . Sexual activity: Not on file  Other Topics Concern  . Not on file  Social History Narrative  . Not on file   Social Determinants of  Health   Financial Resource Strain:   . Difficulty of Paying Living Expenses: Not on file  Food Insecurity:   . Worried About Charity fundraiser in the Last Year: Not on file  . Ran Out of Food in the Last Year: Not on file  Transportation Needs:   . Lack of Transportation (Medical): Not on file  . Lack of Transportation (Non-Medical): Not on file  Physical Activity:   . Days of Exercise per Week: Not on file  . Minutes of Exercise per Session: Not on file  Stress:   . Feeling of Stress : Not on file  Social Connections:   . Frequency of Communication with Friends and Family: Not on file  . Frequency of Social Gatherings with Friends and Family: Not on file  . Attends Religious Services: Not on file  . Active Member of Clubs or Organizations: Not on file  . Attends Archivist Meetings: Not on file  . Marital Status: Not on file   Past Medical History:  Diagnosis Date  . Anemia   . Arthritis   . Depression   . HLD (hyperlipidemia)   . Hypertension    The patient has a family history of  Review of Systems  Constitutional: Negative for chills, fatigue and fever.  HENT: Negative for congestion, ear pain and sore throat.   Respiratory: Positive for cough. Negative for shortness  of breath.        Mild. Over the last couple of days.   Cardiovascular: Negative for chest pain.  Skin: Positive for rash.       On low back pain. Patient is concerned about body lice she feels she may have gotten it when in the hospital. Pruritis.      Objective:  BP 108/60 (BP Location: Left Arm, Patient Position: Sitting, Cuff Size: Normal)   Pulse 88   Temp 99.4 F (37.4 C)   Resp 16   Ht 5' (1.524 m)   Wt 138 lb 9.6 oz (62.9 kg)   BMI 27.07 kg/m   BP/Weight 11/27/2019 11/13/2019 10/12/2019  Systolic BP 108 116 120  Diastolic BP 60 60 82  Wt. (Lbs) 138.6 140.8 -  BMI 27.07 26.6 -    Physical Exam Vitals reviewed.  Constitutional:      Appearance: Normal appearance.   Cardiovascular:     Rate and Rhythm: Normal rate and regular rhythm.  Pulmonary:     Effort: Pulmonary effort is normal.     Breath sounds: Normal breath sounds.  Abdominal:     General: Bowel sounds are normal.     Palpations: Abdomen is soft.     Tenderness: There is no abdominal tenderness.  Musculoskeletal:     Lumbar back: Normal.  Skin:    Findings: Rash present.  Neurological:     Mental Status: She is alert.  Psychiatric:        Mood and Affect: Mood is anxious.   Mood is better.  Sporadic scabs on lumbar back.  Lab Results  Component Value Date   WBC 9.5 10/12/2019   HGB 13.2 10/12/2019   HCT 39.2 10/12/2019   PLT 330 10/12/2019   GLUCOSE 104 (H) 11/18/2019   ALT 53 (H) 11/18/2019   AST 28 11/18/2019   NA 142 11/18/2019   K 4.2 11/18/2019   CL 103 11/18/2019   CREATININE 0.74 11/18/2019   BUN 5 (L) 11/18/2019   CO2 24 11/18/2019      Assessment & Plan:   Problem List Items Addressed This Visit      Respiratory   Nocturnal hypoxia     Musculoskeletal and Integument   Scabies     Other   GAD (generalized anxiety disorder)   Relevant Medications   sertraline (ZOLOFT) 50 MG tablet   Adjustment insomnia - Primary   Lumbar back pain      No problem-specific Assessment & Plan notes found for this encounter.   Meds ordered this encounter  Medications  . sertraline (ZOLOFT) 50 MG tablet    Sig: Take 1 tablet (50 mg total) by mouth daily.    Dispense:  30 tablet    Refill:  3  . DISCONTD: permethrin (ELIMITE) 5 % cream  . pyrethrins-piperonyl butoxide 0.5 % bottle    Sig: Apply topically once for 1 dose. Apply from neck to toes. Leave on all night and shower in am. Wash sheets in am in hot water    Dispense:  150 mL    Refill:  0    AN INDIVIDUALIZED CARE PLAN: was established or reinforced today.   The patient's disease status was assessed using clinical findings on exam, labs, and/or other diagnostic testing, such as xrays, to determine  his/her success in meeting treatment goals based on disease specific evidence-based guidelines and found to be well controlled.   SELF MANAGEMENT: The patient and I together assessed ways to personally work  towards obtaining the recommended goals  Support needs The patient and/or family needs were assessed and services were offered and not necessary at this time.    Follow-up: No follow-ups on file.  A CLINICAL SUMMARY including a written plan identify barriers to care unique to individual due to social or financial issues and help create solutions together. and a patient's and the patient's families understanding of their medical issues and care needs   Louanne Skye [facility] [phone]

## 2019-11-30 ENCOUNTER — Encounter: Payer: Self-pay | Admitting: Family Medicine

## 2019-11-30 NOTE — Assessment & Plan Note (Signed)
Fairly well controlled.  Continue Zoloft 50 mg once daily.  Continue clonazepam 1 mg twice a day as needed for severe anxiety.  I have recommended the patient attempt to wean off of this medicine as she is currently taking it twice a day.

## 2019-11-30 NOTE — Assessment & Plan Note (Signed)
Patient given a prescription for permethrin.  Although I feel is unlikely this is scabies due to the patient's anxiety will go ahead and treat just in case.

## 2019-11-30 NOTE — Assessment & Plan Note (Signed)
Back pain has resolved.  Recommended continue stretching exercises to maintain a healthy back.

## 2019-11-30 NOTE — Assessment & Plan Note (Signed)
Continue melatonin 3 mg 1 at night as needed for insomnia.

## 2019-11-30 NOTE — Patient Instructions (Signed)
Nocturnal hypoxia Patient to continue to wear oxygen at 2 L while asleep.  Will reassess in approximately 1 month.  Scabies Patient given a prescription for permethrin.  Although I feel is unlikely this is scabies due to the patient's anxiety will go ahead and treat just in case.  Adjustment insomnia Continue melatonin 3 mg 1 at night as needed for insomnia.  GAD (generalized anxiety disorder) Fairly well controlled.  Continue Zoloft 50 mg once daily.  Continue clonazepam 1 mg twice a day as needed for severe anxiety.  I have recommended the patient attempt to wean off of this medicine as she is currently taking it twice a day.  Lumbar back pain Back pain has resolved.  Recommended continue stretching exercises to maintain a healthy back.

## 2019-11-30 NOTE — Assessment & Plan Note (Signed)
Patient to continue to wear oxygen at 2 L while asleep.  Will reassess in approximately 1 month.

## 2019-12-03 ENCOUNTER — Other Ambulatory Visit: Payer: Self-pay

## 2019-12-03 ENCOUNTER — Ambulatory Visit (INDEPENDENT_AMBULATORY_CARE_PROVIDER_SITE_OTHER)
Admission: RE | Admit: 2019-12-03 | Discharge: 2019-12-03 | Disposition: A | Discharge status: Home / Self Care | Payer: 59 | Source: Ambulatory Visit | Attending: Pulmonary Disease | Admitting: Pulmonary Disease

## 2019-12-03 DIAGNOSIS — U071 COVID-19: Secondary | ICD-10-CM

## 2019-12-03 DIAGNOSIS — J1282 Pneumonia due to Coronavirus disease 2019: Secondary | ICD-10-CM | POA: Diagnosis not present

## 2019-12-04 ENCOUNTER — Inpatient Hospital Stay: Admission: RE | Admit: 2019-12-04 | Payer: 59 | Source: Ambulatory Visit

## 2019-12-11 ENCOUNTER — Telehealth: Payer: Self-pay | Admitting: Pulmonary Disease

## 2019-12-11 NOTE — Telephone Encounter (Signed)
Dr Isaiah Serge, please advise on ct chest results from 12/03/19 thanks

## 2019-12-11 NOTE — Telephone Encounter (Signed)
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

## 2020-01-06 ENCOUNTER — Telehealth: Payer: Self-pay

## 2020-01-06 NOTE — Telephone Encounter (Signed)
Heidi Schmidt called to report that she has been weaning off the clonazepam and is taking the zoloft.  She currently is taking the clonazepam daily.  She is questioning when she should wean down further and how you recommend she do this.

## 2020-01-06 NOTE — Telephone Encounter (Signed)
Wean off clonazepam to one pill twice a day for 2 weeks, then  one pill daily for 2 weeks then stop. Continue zoloft daily. If wishes to come off zoloft, may stop after weaning off clonazepam. KC

## 2020-01-08 NOTE — Telephone Encounter (Signed)
Patient informed. 

## 2020-01-12 ENCOUNTER — Other Ambulatory Visit
Admission: RE | Admit: 2020-01-12 | Discharge: 2020-01-12 | Disposition: A | Discharge status: Home / Self Care | Payer: 59 | Source: Ambulatory Visit | Attending: Primary Care | Admitting: Primary Care

## 2020-01-12 DIAGNOSIS — Z20822 Contact with and (suspected) exposure to covid-19: Secondary | ICD-10-CM | POA: Diagnosis not present

## 2020-01-12 DIAGNOSIS — Z01812 Encounter for preprocedural laboratory examination: Secondary | ICD-10-CM | POA: Diagnosis not present

## 2020-01-12 LAB — SARS CORONAVIRUS 2 (TAT 6-24 HRS): SARS Coronavirus 2: NEGATIVE

## 2020-01-14 ENCOUNTER — Other Ambulatory Visit: Payer: Self-pay

## 2020-01-14 ENCOUNTER — Ambulatory Visit (INDEPENDENT_AMBULATORY_CARE_PROVIDER_SITE_OTHER): Payer: 59 | Admitting: Pulmonary Disease

## 2020-01-14 ENCOUNTER — Ambulatory Visit: Payer: 59 | Admitting: Primary Care

## 2020-01-14 DIAGNOSIS — U071 COVID-19: Secondary | ICD-10-CM | POA: Diagnosis not present

## 2020-01-14 DIAGNOSIS — J9600 Acute respiratory failure, unspecified whether with hypoxia or hypercapnia: Secondary | ICD-10-CM

## 2020-01-14 LAB — PULMONARY FUNCTION TEST
DL/VA % pred: 90 %
DL/VA: 3.93 ml/min/mmHg/L
DLCO cor % pred: 80 %
DLCO cor: 14.74 ml/min/mmHg
DLCO unc % pred: 80 %
DLCO unc: 14.74 ml/min/mmHg
FEF 25-75 Post: 2.93 L/sec
FEF 25-75 Pre: 2.46 L/sec
FEF2575-%Change-Post: 19 %
FEF2575-%Pred-Post: 129 %
FEF2575-%Pred-Pre: 108 %
FEV1-%Change-Post: 2 %
FEV1-%Pred-Post: 105 %
FEV1-%Pred-Pre: 102 %
FEV1-Post: 2.46 L
FEV1-Pre: 2.39 L
FEV1FVC-%Change-Post: 3 %
FEV1FVC-%Pred-Pre: 105 %
FEV6-%Change-Post: -2 %
FEV6-%Pred-Post: 97 %
FEV6-%Pred-Pre: 99 %
FEV6-Post: 2.81 L
FEV6-Pre: 2.87 L
FEV6FVC-%Pred-Post: 103 %
FEV6FVC-%Pred-Pre: 103 %
FVC-%Change-Post: 0 %
FVC-%Pred-Post: 95 %
FVC-%Pred-Pre: 95 %
FVC-Post: 2.85 L
FVC-Pre: 2.87 L
Post FEV1/FVC ratio: 86 %
Post FEV6/FVC ratio: 100 %
Pre FEV1/FVC ratio: 83 %
Pre FEV6/FVC Ratio: 100 %

## 2020-01-14 NOTE — Progress Notes (Signed)
PFT done today. 

## 2020-01-15 ENCOUNTER — Ambulatory Visit: Payer: 59 | Admitting: Primary Care

## 2020-01-19 ENCOUNTER — Ambulatory Visit (INDEPENDENT_AMBULATORY_CARE_PROVIDER_SITE_OTHER): Payer: 59 | Admitting: Primary Care

## 2020-01-19 ENCOUNTER — Telehealth: Payer: Self-pay | Admitting: Primary Care

## 2020-01-19 ENCOUNTER — Other Ambulatory Visit: Payer: Self-pay

## 2020-01-19 DIAGNOSIS — G4734 Idiopathic sleep related nonobstructive alveolar hypoventilation: Secondary | ICD-10-CM

## 2020-01-19 DIAGNOSIS — J1282 Pneumonia due to coronavirus disease 2019: Secondary | ICD-10-CM

## 2020-01-19 DIAGNOSIS — J849 Interstitial pulmonary disease, unspecified: Secondary | ICD-10-CM

## 2020-01-19 DIAGNOSIS — U071 COVID-19: Secondary | ICD-10-CM | POA: Diagnosis not present

## 2020-01-19 NOTE — Telephone Encounter (Signed)
Patient had ONO with Dr. Sedalia Muta, looking for results. May have been sent to Dr. Isaiah Serge. Patient need repeat HRCT chest re: ILD  in 6 months with follow-up.

## 2020-01-19 NOTE — Telephone Encounter (Signed)
I could not locate ONO in Dr Shirlee More lookat  Will call Dr Sedalia Muta tomorrow for the results to be faxed  I placed order for the HRCT and 6 month reminder

## 2020-01-19 NOTE — Progress Notes (Signed)
Virtual Visit via Telephone Note  I connected with Heidi Schmidt on 01/19/20 at  4:00 PM EDT by telephone and verified that I am speaking with the correct person using two identifiers.  Location: Patient: Home Provider: Home   I discussed the limitations, risks, security and privacy concerns of performing an evaluation and management service by telephone and the availability of in person appointments. I also discussed with the patient that there may be a patient responsible charge related to this service. The patient expressed understanding and agreed to proceed.   History of Present Illness: 23 59 year old female, never smoked. PMH significant for acute respiratory failure d.t covid -19 pneumonia, nocturnal hypoxia, hypertension, dyslipidemia, GAD, adjustment insomnia. Patient of Dr. Isaiah Serge, seen for initial consult 11/13/19. She was hospitalized from 12/22-12/27/20 for COVID-19. Treated with streiods, remdesivir and supplemental oxygen. CXR showed bilateral infiltrates. At home she took off supplemental oxygen but put it back on at night after noting her sats were in the 80s upon waking. She was ordered for ONO- results sent to Dr. Shirlee More office. Ordered for HRCT and PFTs.    01/19/2020 Patient contacted today for televisit to review recent PFTs. She feels well. No acute complaints. States that she becomes short of breath mainly with anxiety. She had been wearing her oxygen at night but has not seen any levels <90% recently. She had ONO study with Dr. Sedalia Muta, results not available to epic but will contact office to have them faxed. She is asking about when she can get both the covid-19 and pneumonia vaccines.   Observations/Objective:  - Appears well, able to speak in full sentences.   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 - PFTs showed normal spirometery and DLCO  - Ok to get COVID-19 vaccine and recommend getting pneumococcal 23  - FU in 6 months   Follow Up Instructions:  6 months with Dr. Isaiah Serge   I discussed the assessment and treatment plan with the patient. The patient was provided an opportunity to ask questions and all were answered. The patient agreed with the plan and demonstrated an understanding of the instructions.   The patient was advised to call back or seek an in-person evaluation if the symptoms worsen or if the condition fails to improve as anticipated.  I provided 18 minutes of non-face-to-face time during this encounter.   Glenford Bayley, NP

## 2020-01-20 ENCOUNTER — Encounter: Payer: Self-pay | Admitting: Primary Care

## 2020-01-20 NOTE — Telephone Encounter (Signed)
Called and spoke with Amber at Dr. Renea Ee office. She reviewed the patient's chart and could not see any ONO results. Explained to her what the results would look like. She stated that it is possible that the document has already been sent to scan. She will check with the referral coordinator to see if perhaps it was sent to her. She will call us back.   Will leave this encounter open and in triage for follow up.

## 2020-01-20 NOTE — Patient Instructions (Addendum)
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 - PFTs showed normal spirometery and DLCO  - Ok to get COVID-19 vaccine and recommend getting pneumococcal 23   Follow-up: - FU in 6 months with Dr. Isaiah Serge or sooner if needed  COVID-19 Vaccine Information can be found at: [email or web address] For questions related to vaccine distribution or appointments, please email [email or web address] or call [phone].

## 2020-01-26 ENCOUNTER — Other Ambulatory Visit: Payer: Self-pay | Admitting: Family Medicine

## 2020-01-27 ENCOUNTER — Telehealth: Payer: Self-pay | Admitting: Primary Care

## 2020-01-27 NOTE — Telephone Encounter (Signed)
We have not received ONO from her primary care. We need to contact them and ask them to re-fax it attention [facility]

## 2020-01-27 NOTE — Telephone Encounter (Signed)
[name], have you received this ONO yet?

## 2020-01-27 NOTE — Telephone Encounter (Signed)
No I have not, would you check to see if it is in Dr. Daneil Dan folder? It was not done with our office.

## 2020-01-27 NOTE — Telephone Encounter (Signed)
Pt called about wanting to get off O2 at night and stated that [facility] needed the results of ONO from PCP.  Note from [name]:   The last note I can see was in February mentioning ONO/ oxygen from Dr. Sedalia Muta. She needs to have their office send this to [place] to review. [employer], I can not advise if she can discontinue use until I get these.  "Patient also underwent an overnight oximetry test which indicated she needs Oxygen still at night. This was started when in the hospital for COVID-19. She was discharged on 2 L of oxygen. She is currently only using it at nighttime. Her home oxygen pulsometer shows her oxygen to stay above 90 %. It drops at night time. Patient is seeing pulmonology"   Called [facility] and spoke with Hospital doctor who then transferred me to Dr. Renea Ee nurse Eber Jones, unable to speak to Eber Jones so left her a detailed message in regards to what we were needing and provided her with our phone number and fax numbers.

## 2020-01-27 NOTE — Telephone Encounter (Signed)
The last note I can see was in February mentioning ONO/ oxygen from Dr. Sedalia Muta. She needs to have their office send this to [place] to review. [employer], I can not advise if she can discontinue use until I get these.   "Patient also underwent an overnight oximetry test which indicated she needs Oxygen still at night. This was started when in the hospital for COVID-19.  She was discharged on 2 L of oxygen.  She is currently only using it at nighttime.  Her home oxygen pulsometer shows her oxygen to stay above 90 %. It drops at night time. Patient is seeing pulmonology"

## 2020-01-27 NOTE — Telephone Encounter (Signed)
Checked both [name]'s and Dr. Shirlee More boxes and there was no ono results in either of their boxes. Due to a duplicate encounter already being opened for this same reason, I am going to close this encounter so we can refer back to the encounter from 4/5.

## 2020-01-28 NOTE — Telephone Encounter (Signed)
Left another message for Eber Jones to call back.

## 2020-01-28 NOTE — Telephone Encounter (Signed)
Eber Jones from [employer] is returning phone call. Eber Jones phone number is [phone]. May leave detailed message.

## 2020-02-10 NOTE — Telephone Encounter (Signed)
Attempted to call pt but unable to reach. Left message for pt to return call. 

## 2020-02-10 NOTE — Telephone Encounter (Signed)
Received ONO results that were donw by PCP in January. Patient spent 7 mins SpO2 <88%, SpO2 Low 82% with baselin 91%. This qualifies her for oxygen. Recommend she continue 2L oxygen.   If she is having any daytime fatigue symptoms or reports snoring/apnea would recommend split night sleep study. Her heart heart does go out when her oxygen level falls which can be seen in sleep apnea. If she has any of the above symptoms would want to rule it out.

## 2020-02-11 NOTE — Telephone Encounter (Signed)
Attempted to call pt but unable to reach x2. Left message for pt to return call.

## 2020-02-12 ENCOUNTER — Telehealth: Payer: Self-pay | Admitting: Primary Care

## 2020-02-12 NOTE — Telephone Encounter (Signed)
Response from [facility] in the previous encounter that had been closed: Glenford Bayley, NP     3:20 PM Note   Received ONO results that were donw by PCP in January. Patient spent 7 mins SpO2 <88%, SpO2 Low 82% with baselin 91%. This qualifies her for oxygen. Recommend she continue 2L oxygen.   If she is having any daytime fatigue symptoms or reports snoring/apnea would recommend split night sleep study. Her heart heart does go out when her oxygen level falls which can be seen in sleep apnea. If she has any of the above symptoms would want to rule it out.      Called and spoke with pt letting her know the info stated by [facility] about her O2. Asked her if she was currently wearing the O2 at night.  Pt said she has not been wearing her O2 at night and states the last time she did wear it was 01/16/20. She said that there are times she is not sleeping that well some nights as she states MD is trying to get her off of the clonazepam which she states she has been off of it now for 4 weeks. Pt states she was taking that after she had covid due to having problems with anxiety after she had the covid. Pt was then switched to zoloft. Pt stated when she was on the clonazepam it was helping with her sleep.  Pt does want to try to stay off of the O2. [name], please advise if you want to repeat the ONO to see if there has been any change since the last time pt had it done.

## 2020-02-12 NOTE — Telephone Encounter (Signed)
Error--open note already existed.

## 2020-02-12 NOTE — Telephone Encounter (Signed)
Pt calling back about this matter.   Please return call on mobile [phone] anytime today.

## 2020-02-13 NOTE — Telephone Encounter (Signed)
Yes repeat ONO since changes and last was in January

## 2020-02-13 NOTE — Telephone Encounter (Signed)
LMTCB x1 for pt.  Need to know who her DME is so we can placed ONO order.

## 2020-02-24 NOTE — Progress Notes (Signed)
Subjective:  Patient ID: Heidi Schmidt, female    DOB: 1961-06-23  Age: 59 y.o. MRN: [id number]  Chief Complaint  Patient presents with  . Depression  . Hyperlipidemia  . Hypertension    HPI GAD: Patient is feeling much better. She is feeling back to her normal self finally after having covid 19 in December 2020. The zoloft is wokring very well.         Associated symptoms include no fatigue, no depression..  Hyperlipidemia - Patient has a history of high cholesterol which has been untreated. Patient tries to eat healthy. Last LDL in 03/2018 was very high (151.)  She says she is eating healthy. Pertinent negatives include no chest pain or shortness of breath.   Post covid fatigue has greatly improved!   Past Medical History:  Diagnosis Date  . Acute respiratory failure due to COVID-19 ([place]) 10/07/2019  . Anemia   . Arthritis   . Depression   . HLD (hyperlipidemia)   . Hypertension    Past Surgical History:  Procedure Laterality Date  . CESAREAN SECTION      Family History  Problem Relation Age of Onset  . Hypertension Mother   . Diabetes Mother   . Cancer Mother   . Heart disease Mother   . Diabetes Brother   . Diabetes Maternal Grandfather    Social History   Socioeconomic History  . Marital status: Single    Spouse name: Not on file  . Number of children: Not on file  . Years of education: Not on file  . Highest education level: Not on file  Occupational History  . Not on file  Tobacco Use  . Smoking status: Never Smoker  . Smokeless tobacco: Never Used  Substance and Sexual Activity  . Alcohol use: Not Currently  . Drug use: Never  . Sexual activity: Not on file  Other Topics Concern  . Not on file  Social History Narrative  . Not on file   Social Determinants of Health   Financial Resource Strain:   . Difficulty of Paying Living Expenses:   Food Insecurity:   . Worried About Charity fundraiser in the Last Year:   . Arboriculturist in the Last  Year:   Transportation Needs:   . Film/video editor (Medical):   Marland Kitchen Lack of Transportation (Non-Medical):   Physical Activity:   . Days of Exercise per Week:   . Minutes of Exercise per Session:   Stress:   . Feeling of Stress :   Social Connections:   . Frequency of Communication with Friends and Family:   . Frequency of Social Gatherings with Friends and Family:   . Attends Religious Services:   . Active Member of Clubs or Organizations:   . Attends Archivist Meetings:   Marland Kitchen Marital Status:    Review of Systems  Constitutional: Negative for chills, diaphoresis, fatigue and fever.  HENT: Positive for ear pain (pressure on whichever ear she is sleeping on. ). Negative for congestion, rhinorrhea and sore throat.   Respiratory: Negative for cough, shortness of breath and wheezing.   Cardiovascular: Negative for chest pain.  Gastrointestinal: Negative for abdominal pain, constipation, diarrhea and vomiting.  Musculoskeletal: Negative for arthralgias (Improved since starting glucosamine chondroitin again 2 weeks ago.) and back pain.  Neurological: Positive for numbness (burning lower legs to feet. constant.).  Psychiatric/Behavioral: Positive for depression. Negative for dysphoric mood.       No anhedonia. Wishes  to wean of zoloft.   Objective:  Pulse 89   Temp 97.6 F (36.4 C)   Ht 5\' 1"  (1.549 m)   Wt 124 lb (56.2 kg)   SpO2 95%   BMI 23.43 kg/m   BP/Weight 02/26/2020 11/27/2019 11/13/2019  Systolic BP - 108 116  Diastolic BP - 60 60  Wt. (Lbs) 124 138.6 140.8  BMI 23.43 27.07 26.6    Physical Exam Vitals reviewed.  Constitutional:      Appearance: Normal appearance. She is normal weight.  Cardiovascular:     Rate and Rhythm: Normal rate and regular rhythm.     Pulses: Normal pulses.     Heart sounds: Normal heart sounds.  Pulmonary:     Effort: Pulmonary effort is normal. No respiratory distress.     Breath sounds: Normal breath sounds.  Abdominal:      General: Abdomen is flat. Bowel sounds are normal.     Palpations: Abdomen is soft.     Tenderness: There is no abdominal tenderness.  Neurological:     Mental Status: She is alert and oriented to person, place, and time.  Psychiatric:        Mood and Affect: Mood normal.        Behavior: Behavior normal.     Diabetic Foot Exam - Simple   No data filed       Lab Results  Component Value Date   WBC 6.0 02/27/2020   HGB 14.1 02/27/2020   HCT 42.0 02/27/2020   PLT 195 02/27/2020   GLUCOSE 89 02/27/2020   CHOL 246 (H) 02/27/2020   TRIG 119 02/27/2020   HDL 63 02/27/2020   LDLCALC 162 (H) 02/27/2020   ALT 20 02/27/2020   AST 23 02/27/2020   NA 138 02/27/2020   K 4.4 02/27/2020   CL 100 02/27/2020   CREATININE 1.00 02/27/2020   BUN 12 02/27/2020   CO2 25 02/27/2020      Assessment & Plan:   1. Dyslipidemia Poorly controlled.  Continue to work on eating a healthy diet and exercise.  Labs drawn today.  - Lipid panel. CMP  Check vitamin for niacin. If niacin present, switch vitamins. If you want to use them up, take a baby aspirin 81 mg once daily before the vitamin, may dose at bedtime, avoid hot beverages or alcohol when taking, and you may also try a high fiber snack such as triscuits/apples/peanut butter.  2. Paresthesias - unclear etiology. - B12 and Folate Panel  3. Depression, mild, recurrent -   May decrease zoloft to 50 mg 1/2 tablet daily for 1-2 weeks and if feeling okay, may discontinue.  Meds ordered this encounter  Medications  . cetirizine (ZYRTEC) 10 MG tablet    Sig: Take 1 tablet (10 mg total) by mouth at bedtime.    Dispense:  90 tablet    Refill:  3    Orders Placed This Encounter  Procedures  . CBC with Differential/Platelet  . Comprehensive metabolic panel  . Lipid panel  . B12 and Folate Panel  . Cardiovascular Risk Assessment     Follow-up: Return in about 1 day (around 02/27/2020) for fasting labwork..  An After Visit Summary  was printed and given to the patient.  02/29/2020 [facility] [phone]

## 2020-02-26 ENCOUNTER — Encounter: Payer: Self-pay | Admitting: Family Medicine

## 2020-02-26 ENCOUNTER — Ambulatory Visit: Payer: 59 | Admitting: Family Medicine

## 2020-02-26 ENCOUNTER — Other Ambulatory Visit: Payer: Self-pay

## 2020-02-26 VITALS — HR 89 | Temp 97.6°F | Ht 61.0 in | Wt 124.0 lb

## 2020-02-26 DIAGNOSIS — R202 Paresthesia of skin: Secondary | ICD-10-CM

## 2020-02-26 DIAGNOSIS — I10 Essential (primary) hypertension: Secondary | ICD-10-CM

## 2020-02-26 DIAGNOSIS — F411 Generalized anxiety disorder: Secondary | ICD-10-CM

## 2020-02-26 DIAGNOSIS — E785 Hyperlipidemia, unspecified: Secondary | ICD-10-CM | POA: Diagnosis not present

## 2020-02-26 MED ORDER — CETIRIZINE HCL 10 MG PO TABS: 10.0000 mg | ORAL_TABLET | Freq: Every day | ORAL | 3 refills | Status: DC

## 2020-02-26 NOTE — Progress Notes (Deleted)
Subjective:  Patient ID: Heidi Schmidt, female    DOB: 19-Nov-1960  Age: 59 y.o. MRN: [id number]  Chief Complaint  Patient presents with  . Depression  . Hyperlipidemia  . Hypertension    Depression        Associated symptoms include no fatigue. Hyperlipidemia Pertinent negatives include no chest pain or shortness of breath.  Hypertension Pertinent negatives include no chest pain or shortness of breath.      Past Medical History:  Diagnosis Date  . Acute respiratory failure due to COVID-19 ([place]) 10/07/2019  . Anemia   . Arthritis   . Depression   . HLD (hyperlipidemia)   . Hypertension    Past Surgical History:  Procedure Laterality Date  . CESAREAN SECTION      Family History  Problem Relation Age of Onset  . Hypertension Mother   . Diabetes Mother   . Cancer Mother   . Heart disease Mother   . Diabetes Brother   . Diabetes Maternal Grandfather    Social History   Socioeconomic History  . Marital status: Single    Spouse name: Not on file  . Number of children: Not on file  . Years of education: Not on file  . Highest education level: Not on file  Occupational History  . Not on file  Tobacco Use  . Smoking status: Never Smoker  . Smokeless tobacco: Never Used  Substance and Sexual Activity  . Alcohol use: Not Currently  . Drug use: Never  . Sexual activity: Not on file  Other Topics Concern  . Not on file  Social History Narrative  . Not on file   Social Determinants of Health   Financial Resource Strain:   . Difficulty of Paying Living Expenses:   Food Insecurity:   . Worried About Charity fundraiser in the Last Year:   . Arboriculturist in the Last Year:   Transportation Needs:   . Film/video editor (Medical):   Marland Kitchen Lack of Transportation (Non-Medical):   Physical Activity:   . Days of Exercise per Week:   . Minutes of Exercise per Session:   Stress:   . Feeling of Stress :   Social Connections:   . Frequency of Communication with  Friends and Family:   . Frequency of Social Gatherings with Friends and Family:   . Attends Religious Services:   . Active Member of Clubs or Organizations:   . Attends Archivist Meetings:   Marland Kitchen Marital Status:     Review of Systems  Constitutional: Negative for chills, diaphoresis, fatigue and fever.  HENT: Positive for ear pain (pressure on whichever ear she is sleeping on. ). Negative for congestion, rhinorrhea and sore throat.   Respiratory: Negative for cough, shortness of breath and wheezing.   Cardiovascular: Negative for chest pain.  Gastrointestinal: Negative for abdominal pain, constipation, diarrhea and vomiting.  Musculoskeletal: Negative for arthralgias (Improved since starting glucosamine chondroitin again 2 weeks ago.) and back pain.  Neurological: Positive for numbness (burning lower legs to feet. constant.).  Psychiatric/Behavioral: Positive for depression. Negative for dysphoric mood.       No anhedonia. Wishes to wean of zoloft.     Objective:  Pulse 89   Temp 97.6 F (36.4 C)   Ht 5\' 1"  (1.549 m)   Wt 124 lb (56.2 kg)   SpO2 95%   BMI 23.43 kg/m   BP/Weight 02/26/2020 11/27/2019 3/54/6568  Systolic BP - 127 517  Diastolic  BP - 60 60  Wt. (Lbs) 124 138.6 140.8  BMI 23.43 27.07 26.6    Physical Exam  Diabetic Foot Exam - Simple   No data filed       Lab Results  Component Value Date   WBC 9.5 10/12/2019   HGB 13.2 10/12/2019   HCT 39.2 10/12/2019   PLT 330 10/12/2019   GLUCOSE 104 (H) 11/18/2019   ALT 53 (H) 11/18/2019   AST 28 11/18/2019   NA 142 11/18/2019   K 4.2 11/18/2019   CL 103 11/18/2019   CREATININE 0.74 11/18/2019   BUN 5 (L) 11/18/2019   CO2 24 11/18/2019      Assessment & Plan:   1. Essential hypertension - CBC with Differential/Platelet - Comprehensive metabolic panel  2. Dyslipidemia - Lipid panel    No orders of the defined types were placed in this encounter.   No orders of the defined types were  placed in this encounter.    Follow-up: No follow-ups on file.  An After Visit Summary was printed and given to the patient.  Blane Ohara [facility] [phone]

## 2020-02-26 NOTE — Patient Instructions (Addendum)
Check vitamin for niacin. If niacin present, switch vitamins.  If you want to use them up, take a baby aspirin 81 mg once daily before the vitamin, may dose at bedtime, avoid hot beverages or alcohol when taking, and you may also try a high fiber snack such as triscuits/apples/peanut butter.  May decrease zoloft to 50 mg 1/2 tablet daily for 1-2 weeks and if feeling okay, may discontinue.

## 2020-02-27 ENCOUNTER — Other Ambulatory Visit: Payer: 59

## 2020-02-28 LAB — COMPREHENSIVE METABOLIC PANEL
ALT: 20 IU/L (ref 0–32)
AST: 23 IU/L (ref 0–40)
Albumin/Globulin Ratio: 2.7 — ABNORMAL HIGH (ref 1.2–2.2)
Albumin: 4.6 g/dL (ref 3.8–4.9)
Alkaline Phosphatase: 78 IU/L (ref 39–117)
BUN/Creatinine Ratio: 12 (ref 9–23)
BUN: 12 mg/dL (ref 6–24)
Bilirubin Total: 0.6 mg/dL (ref 0.0–1.2)
CO2: 25 mmol/L (ref 20–29)
Calcium: 10 mg/dL (ref 8.7–10.2)
Chloride: 100 mmol/L (ref 96–106)
Creatinine, Ser: 1 mg/dL (ref 0.57–1.00)
GFR calc Af Amer: 71 mL/min/{1.73_m2} (ref >59)
GFR calc non Af Amer: 62 mL/min/{1.73_m2} (ref >59)
Globulin, Total: 1.7 g/dL (ref 1.5–4.5)
Glucose: 89 mg/dL (ref 65–99)
Potassium: 4.4 mmol/L (ref 3.5–5.2)
Sodium: 138 mmol/L (ref 134–144)
Total Protein: 6.3 g/dL (ref 6.0–8.5)

## 2020-02-28 LAB — LIPID PANEL
Chol/HDL Ratio: 3.9 ratio (ref 0.0–4.4)
Cholesterol, Total: 246 mg/dL — ABNORMAL HIGH (ref 100–199)
HDL: 63 mg/dL (ref >39)
LDL Chol Calc (NIH): 162 mg/dL — ABNORMAL HIGH (ref 0–99)
Triglycerides: 119 mg/dL (ref 0–149)
VLDL Cholesterol Cal: 21 mg/dL (ref 5–40)

## 2020-02-28 LAB — CBC WITH DIFFERENTIAL/PLATELET
Basophils Absolute: 0 10*3/uL (ref 0.0–0.2)
Basos: 1 %
EOS (ABSOLUTE): 0 10*3/uL (ref 0.0–0.4)
Eos: 1 %
Hematocrit: 42 % (ref 34.0–46.6)
Hemoglobin: 14.1 g/dL (ref 11.1–15.9)
Immature Grans (Abs): 0 10*3/uL (ref 0.0–0.1)
Immature Granulocytes: 0 %
Lymphocytes Absolute: 0.6 10*3/uL — ABNORMAL LOW (ref 0.7–3.1)
Lymphs: 10 %
MCH: 30 pg (ref 26.6–33.0)
MCHC: 33.6 g/dL (ref 31.5–35.7)
MCV: 89 fL (ref 79–97)
Monocytes Absolute: 0.4 10*3/uL (ref 0.1–0.9)
Monocytes: 7 %
Neutrophils Absolute: 4.9 10*3/uL (ref 1.4–7.0)
Neutrophils: 81 %
Platelets: 195 10*3/uL (ref 150–450)
RBC: 4.7 x10E6/uL (ref 3.77–5.28)
RDW: 12.1 % (ref 11.7–15.4)
WBC: 6 10*3/uL (ref 3.4–10.8)

## 2020-02-28 LAB — B12 AND FOLATE PANEL
Folate: 10.3 ng/mL (ref >3.0)
Vitamin B-12: 342 pg/mL (ref 232–1245)

## 2020-02-28 LAB — CARDIOVASCULAR RISK ASSESSMENT

## 2020-03-22 ENCOUNTER — Telehealth: Payer: Self-pay | Admitting: Pulmonary Disease

## 2020-03-22 DIAGNOSIS — R400 Somnolence: Secondary | ICD-10-CM

## 2020-03-22 DIAGNOSIS — G4734 Idiopathic sleep related nonobstructive alveolar hypoventilation: Secondary | ICD-10-CM

## 2020-03-22 NOTE — Telephone Encounter (Signed)
Message from [place] from encounter 01/27/20: Heidi Bayley, NP Note   Received ONO results that were donw by PCP in January. Patient spent 7 mins SpO2 <88%, SpO2 Low 82% with baselin 91%. This qualifies her for oxygen. Recommend she continue 2L oxygen.   If she is having any daytime fatigue symptoms or reports snoring/apnea would recommend split night sleep study. Her heart heart does go out when her oxygen level falls which can be seen in sleep apnea. If she has any of the above symptoms would want to rule it out.      Called and spoke with pt letting her know the info stated by [name] from 4/13 and pt verbalized understanding. Pt stated that she does occ have fatigue throughout the day. Order for split night study has been placed. Nothing further needed.

## 2020-03-23 ENCOUNTER — Telehealth: Payer: Self-pay

## 2020-03-23 NOTE — Telephone Encounter (Signed)
Per Dr. Sedalia Muta, the pt is due for a fasting f.up apt after 05/30/2020.Marland Kitchenwhich would be for her 3 month f.up fasting. I spoke with the pt about setting up an appt. Pt to call back once she knows her work schedule.

## 2020-03-25 ENCOUNTER — Other Ambulatory Visit: Payer: Self-pay | Admitting: Family Medicine

## 2020-03-26 ENCOUNTER — Telehealth: Payer: Self-pay | Admitting: Pulmonary Disease

## 2020-03-26 NOTE — Telephone Encounter (Signed)
error 

## 2020-04-08 ENCOUNTER — Other Ambulatory Visit (HOSPITAL_COMMUNITY): Payer: 59

## 2020-04-10 ENCOUNTER — Encounter (HOSPITAL_BASED_OUTPATIENT_CLINIC_OR_DEPARTMENT_OTHER): Payer: 59 | Admitting: Pulmonary Disease

## 2020-04-23 ENCOUNTER — Ambulatory Visit: Payer: 59 | Attending: Pulmonary Disease

## 2020-04-23 DIAGNOSIS — R0902 Hypoxemia: Secondary | ICD-10-CM | POA: Diagnosis not present

## 2020-04-23 DIAGNOSIS — R400 Somnolence: Secondary | ICD-10-CM | POA: Insufficient documentation

## 2020-04-26 ENCOUNTER — Other Ambulatory Visit: Payer: Self-pay

## 2020-05-03 DIAGNOSIS — G4710 Hypersomnia, unspecified: Secondary | ICD-10-CM | POA: Diagnosis not present

## 2020-05-04 ENCOUNTER — Telehealth: Payer: Self-pay

## 2020-05-04 NOTE — Telephone Encounter (Addendum)
Pt is aware of results and voiced her understanding. OV scheduled for 05/21/2020 with Buelah Manis, NP to discuss sleep study.  Nothing further is needed at this time.

## 2020-05-04 NOTE — Telephone Encounter (Addendum)
Sleep study reviewed--recommend following with referring physician, and avoid situations in which daytime sleepiness would represent a hazard. Lm for pt.

## 2020-05-21 ENCOUNTER — Ambulatory Visit: Payer: 59 | Admitting: Primary Care

## 2020-05-21 ENCOUNTER — Ambulatory Visit: Payer: 59 | Admitting: Adult Health

## 2020-06-03 ENCOUNTER — Ambulatory Visit (INDEPENDENT_AMBULATORY_CARE_PROVIDER_SITE_OTHER): Payer: 59 | Admitting: Adult Health

## 2020-06-03 ENCOUNTER — Other Ambulatory Visit: Payer: Self-pay

## 2020-06-03 ENCOUNTER — Encounter: Payer: Self-pay | Admitting: Adult Health

## 2020-06-03 VITALS — BP 110/66 | HR 86 | Ht 61.0 in | Wt 124.0 lb

## 2020-06-03 DIAGNOSIS — J8410 Pulmonary fibrosis, unspecified: Secondary | ICD-10-CM | POA: Diagnosis not present

## 2020-06-03 DIAGNOSIS — J9611 Chronic respiratory failure with hypoxia: Secondary | ICD-10-CM | POA: Diagnosis not present

## 2020-06-03 DIAGNOSIS — G4734 Idiopathic sleep related nonobstructive alveolar hypoventilation: Secondary | ICD-10-CM | POA: Diagnosis not present

## 2020-06-03 NOTE — Progress Notes (Signed)
@Patient  ID: , female    DOB: [birth year], 59 y.o.   MRN: [id number]  Chief Complaint  Patient presents with  . Follow-up    Referring provider: [facility]  HPI: 59 year old female never smoker seen after pulmonary consult for Covid pneumonia.  Patient was hospitalized December 2020 with COVID-19 infection and Covid pneumonia.  She required treatment with steroids, remdesivir and supplemental oxygen.   TEST/EVENTS :   06/03/2020 Follow up : COVID PNA , Post inflammatory Fibrosis  Patient presents for a follow-up visit.  Patient is being followed after she developed COVID-19 and Covid pneumonia in December 2020.  Patient had hospitalization with acute respiratory distress.  She required treatment with steroids remdesivir and supplemental oxygen.  She was discharged on home oxygen.  Patient says that she has been making a slow recovery and over the last 3 months she seems to be really taking a positive turn feeling more like herself.  She was discharged on home oxygen.  But has been able to wean off of oxygen throughout the daytime completely.  Had some episodes where she had some low oxygen levels at nighttime.  She was set up for a sleep study to rule out underlying sleep apnea.  Patient recently had a sleep study done on April 23, 2020 that was negative for sleep apnea.  Or nocturnal hypoxemia. Patient says that over the last 2 months that she has been checking her oxygen levels and they have been remaining in the mid to upper 90s.  She has had no further episodes of low oxygen's during nighttime.  She would like in order to for her oxygen to be discontinued.. Patient says her activity levels are returning back to baseline.  She is remaining active and is returned back to work.  Pulmonary function testing showed normal lung function.  CT chest February 2021 showed some post inflammatory fibrosis.   Allergies  Allergen Reactions  . Dexamethasone Other (See Comments)     facial flushing  . Sertraline Hcl Other (See Comments)    Flushing to face and burning to face and BLE Scaly skin to forehead  . Cephalexin Rash  . Penicillins Rash    Did it involve swelling of the face/tongue/throat, SOB, or low BP? No Did it involve sudden or severe rash/hives, skin peeling, or any reaction on the inside of your mouth or nose? Unknown Did you need to seek medical attention at a hospital or doctor's office? Yes When did it last happen?childhood If all above answers are "NO", may proceed with cephalosporin use.   . Sulfa Antibiotics Rash    Immunization History  Administered Date(s) Administered  . Influenza,inj,Quad PF,6+ Mos 08/13/2019  . PFIZER SARS-COV-2 Vaccination 02/05/2020, 02/26/2020    Past Medical History:  Diagnosis Date  . Acute respiratory failure due to COVID-19 ([facility]) 10/07/2019  . Anemia   . Arthritis   . Depression   . HLD (hyperlipidemia)   . Hypertension     Tobacco History: Social History   Tobacco Use  Smoking Status Never Smoker  Smokeless Tobacco Never Used   Counseling given: Not Answered   Outpatient Medications Prior to Visit  Medication Sig Dispense Refill  . cetirizine (ZYRTEC) 10 MG tablet Take 1 tablet (10 mg total) by mouth at bedtime. 90 tablet 3  . glucosamine-chondroitin 500-400 MG tablet Take 1 tablet by mouth 3 (three) times daily.    10/09/2019 ketoconazole (NIZORAL) 2 % shampoo Apply 1 application topically 2 (two) times a week.    Marland Kitchen  Multiple Vitamin (MULTIVITAMIN WITH MINERALS) TABS tablet Take 1 tablet by mouth daily.    Marland Kitchen nystatin cream (MYCOSTATIN) Apply 1 application topically 2 (two) times daily.    Marland Kitchen triamcinolone cream (KENALOG) 0.1 % APPLY TO BACK THREE TIMES DAILY FOR 2 WEEKS    . sertraline (ZOLOFT) 50 MG tablet TAKE 1 TABLET(50 MG) BY MOUTH DAILY 30 tablet 3   No facility-administered medications prior to visit.     Review of Systems:   Constitutional:   No  weight loss, night sweats,  Fevers,  chills, fatigue, or  lassitude.  HEENT:   No headaches,  Difficulty swallowing,  Tooth/dental problems, or  Sore throat,                No sneezing, itching, ear ache, nasal congestion, post nasal drip,   CV:  No chest pain,  Orthopnea, PND, swelling in lower extremities, anasarca, dizziness, palpitations, syncope.   GI  No heartburn, indigestion, abdominal pain, nausea, vomiting, diarrhea, change in bowel habits, loss of appetite, bloody stools.   Resp: No shortness of breath with exertion or at rest.  No excess mucus, no productive cough,  No non-productive cough,  No coughing up of blood.  No change in color of mucus.  No wheezing.  No chest wall deformity  Skin: no rash or lesions.  GU: no dysuria, change in color of urine, no urgency or frequency.  No flank pain, no hematuria   MS:  No joint pain or swelling.  No decreased range of motion.  No back pain.    Physical Exam  BP 110/66 (BP Location: Left Arm, Cuff Size: Normal)   Pulse 86   Ht 5\' 1"  (1.549 m)   Wt 124 lb (56.2 kg)   SpO2 100%   BMI 23.43 kg/m   GEN: A/Ox3; pleasant , NAD, well nourished    HEENT:  South /AT,  EACs-clear, TMs-wnl, NOSE-clear, THROAT-clear, no lesions, no postnasal drip or exudate noted.   NECK:  Supple w/ fair ROM; no JVD; normal carotid impulses w/o bruits; no thyromegaly or nodules palpated; no lymphadenopathy.    RESP  Clear  P & A; w/o, wheezes/ rales/ or rhonchi. no accessory muscle use, no dullness to percussion  CARD:  RRR, no m/r/g, no peripheral edema, pulses intact, no cyanosis or clubbing.  GI:   Soft & nt; nml bowel sounds; no organomegaly or masses detected.   Musco: Warm bil, no deformities or joint swelling noted.   Neuro: alert, no focal deficits noted.    Skin: Warm, no lesions or rashes    Lab Results:  CBC    Component Value Date/Time   WBC 6.0 02/27/2020 0800   WBC 9.5 10/12/2019 0613   RBC 4.70 02/27/2020 0800   RBC 4.30 10/12/2019 0613   HGB 14.1 02/27/2020  0800   HCT 42.0 02/27/2020 0800   PLT 195 02/27/2020 0800   MCV 89 02/27/2020 0800   MCH 30.0 02/27/2020 0800   MCH 30.7 10/12/2019 0613   MCHC 33.6 02/27/2020 0800   MCHC 33.7 10/12/2019 0613   RDW 12.1 02/27/2020 0800   LYMPHSABS 0.6 (L) 02/27/2020 0800   EOSABS 0.0 02/27/2020 0800   BASOSABS 0.0 02/27/2020 0800    BMET    Component Value Date/Time   NA 138 02/27/2020 0800   K 4.4 02/27/2020 0800   CL 100 02/27/2020 0800   CO2 25 02/27/2020 0800   GLUCOSE 89 02/27/2020 0800   GLUCOSE 96 10/12/2019 0613   BUN  12 02/27/2020 0800   CREATININE 1.00 02/27/2020 0800   CALCIUM 10.0 02/27/2020 0800   GFRNONAA 62 02/27/2020 0800   GFRAA 71 02/27/2020 0800    BNP No results found for: BNP  ProBNP No results found for: PROBNP  Imaging: No results found.    PFT Results Latest Ref Rng & Units 01/14/2020  FVC-Pre L 2.87  FVC-Predicted Pre % 95  FVC-Post L 2.85  FVC-Predicted Post % 95  Pre FEV1/FVC % % 83  Post FEV1/FCV % % 86  FEV1-Pre L 2.39  FEV1-Predicted Pre % 102  FEV1-Post L 2.46  DLCO uncorrected ml/min/mmHg 14.74  DLCO UNC% % 80  DLCO corrected ml/min/mmHg 14.74  DLCO COR %Predicted % 80  DLVA Predicted % 90    No results found for: NITRICOXIDE      Assessment & Plan:   Postinflammatory pulmonary fibrosis (HCC) COVID-19 infection with Covid pneumonia December 2020.  Patient has some postinflammatory pulmonary fibrosis noted on CT chest.  Clinically she has made significant provement no further requires oxygen.  We will continue to monitor closely.  Patient is to follow back up in the office in 4 to 6 months and as needed  Chronic respiratory failure with hypoxia (HCC) Post critical illness with COVID-19 and Covid pneumonia.  Patient has some ongoing exertional and nocturnal hypoxemia.  This has has resolved now.  Recent sleep study showed no sleep apnea or nocturnal hypoxemia.  She may discontinue oxygen.     Rubye Oaks, NP 06/03/2020

## 2020-06-03 NOTE — Assessment & Plan Note (Signed)
Post critical illness with COVID-19 and Covid pneumonia.  Patient has some ongoing exertional and nocturnal hypoxemia.  This has has resolved now.  Recent sleep study showed no sleep apnea or nocturnal hypoxemia.  She may discontinue oxygen.

## 2020-06-03 NOTE — Patient Instructions (Signed)
May discontinue Oxygen at bedtime.  Activity as tolerated.  Follow up with Dr. Isaiah Serge in 4 months and As needed

## 2020-06-03 NOTE — Addendum Note (Signed)
Addended by: Delrae Rend on: 06/03/2020 05:53 PM   Modules accepted: Orders

## 2020-06-03 NOTE — Assessment & Plan Note (Signed)
COVID-19 infection with Covid pneumonia December 2020.  Patient has some postinflammatory pulmonary fibrosis noted on CT chest.  Clinically she has made significant provement no further requires oxygen.  We will continue to monitor closely.  Patient is to follow back up in the office in 4 to 6 months and as needed

## 2020-06-14 IMAGING — DX DG CHEST 2V
2 series · 2 of 2 positions shown · non-contrast
Comparison: 10/20/2019, 10/10/2019

CLINICAL DATA: Dyspnea, S85Q8-R2 infection

EXAM:
CHEST - 2 VIEW

[chest pa]
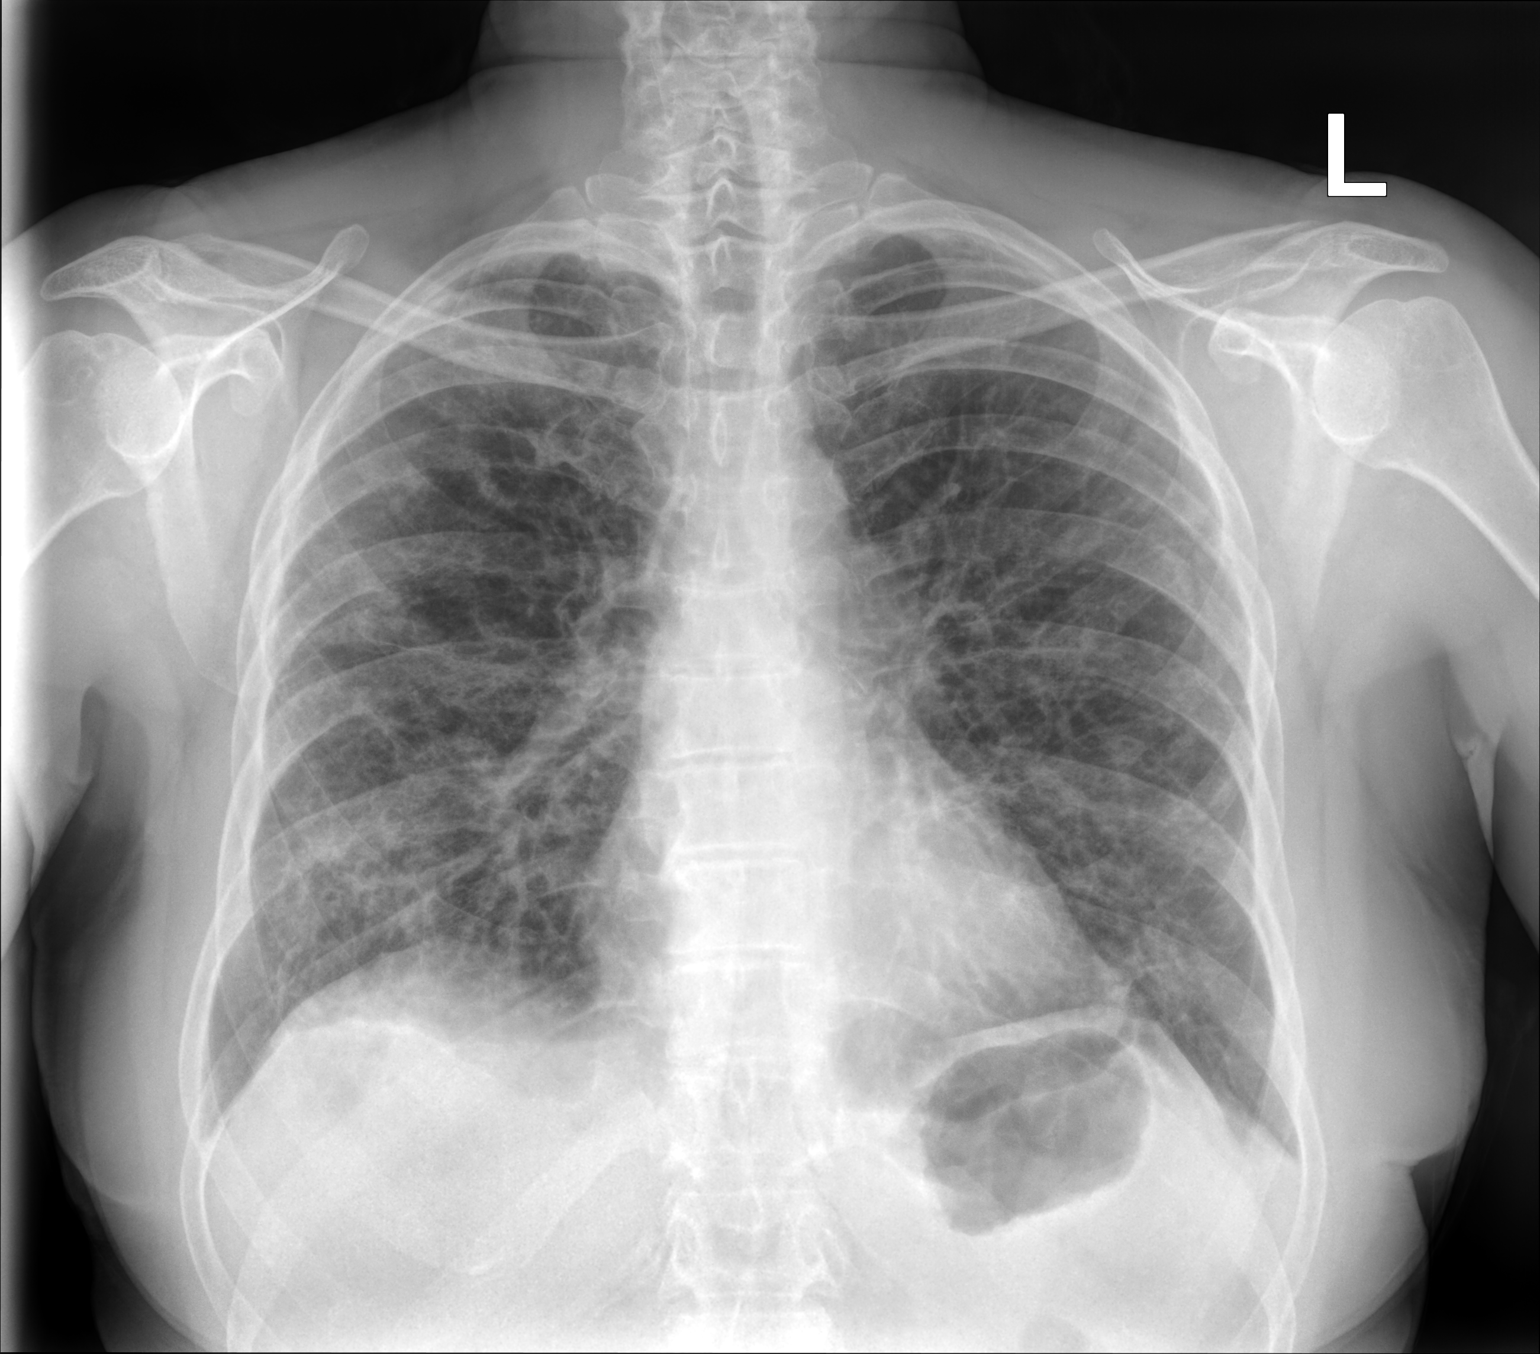

[chest lat]
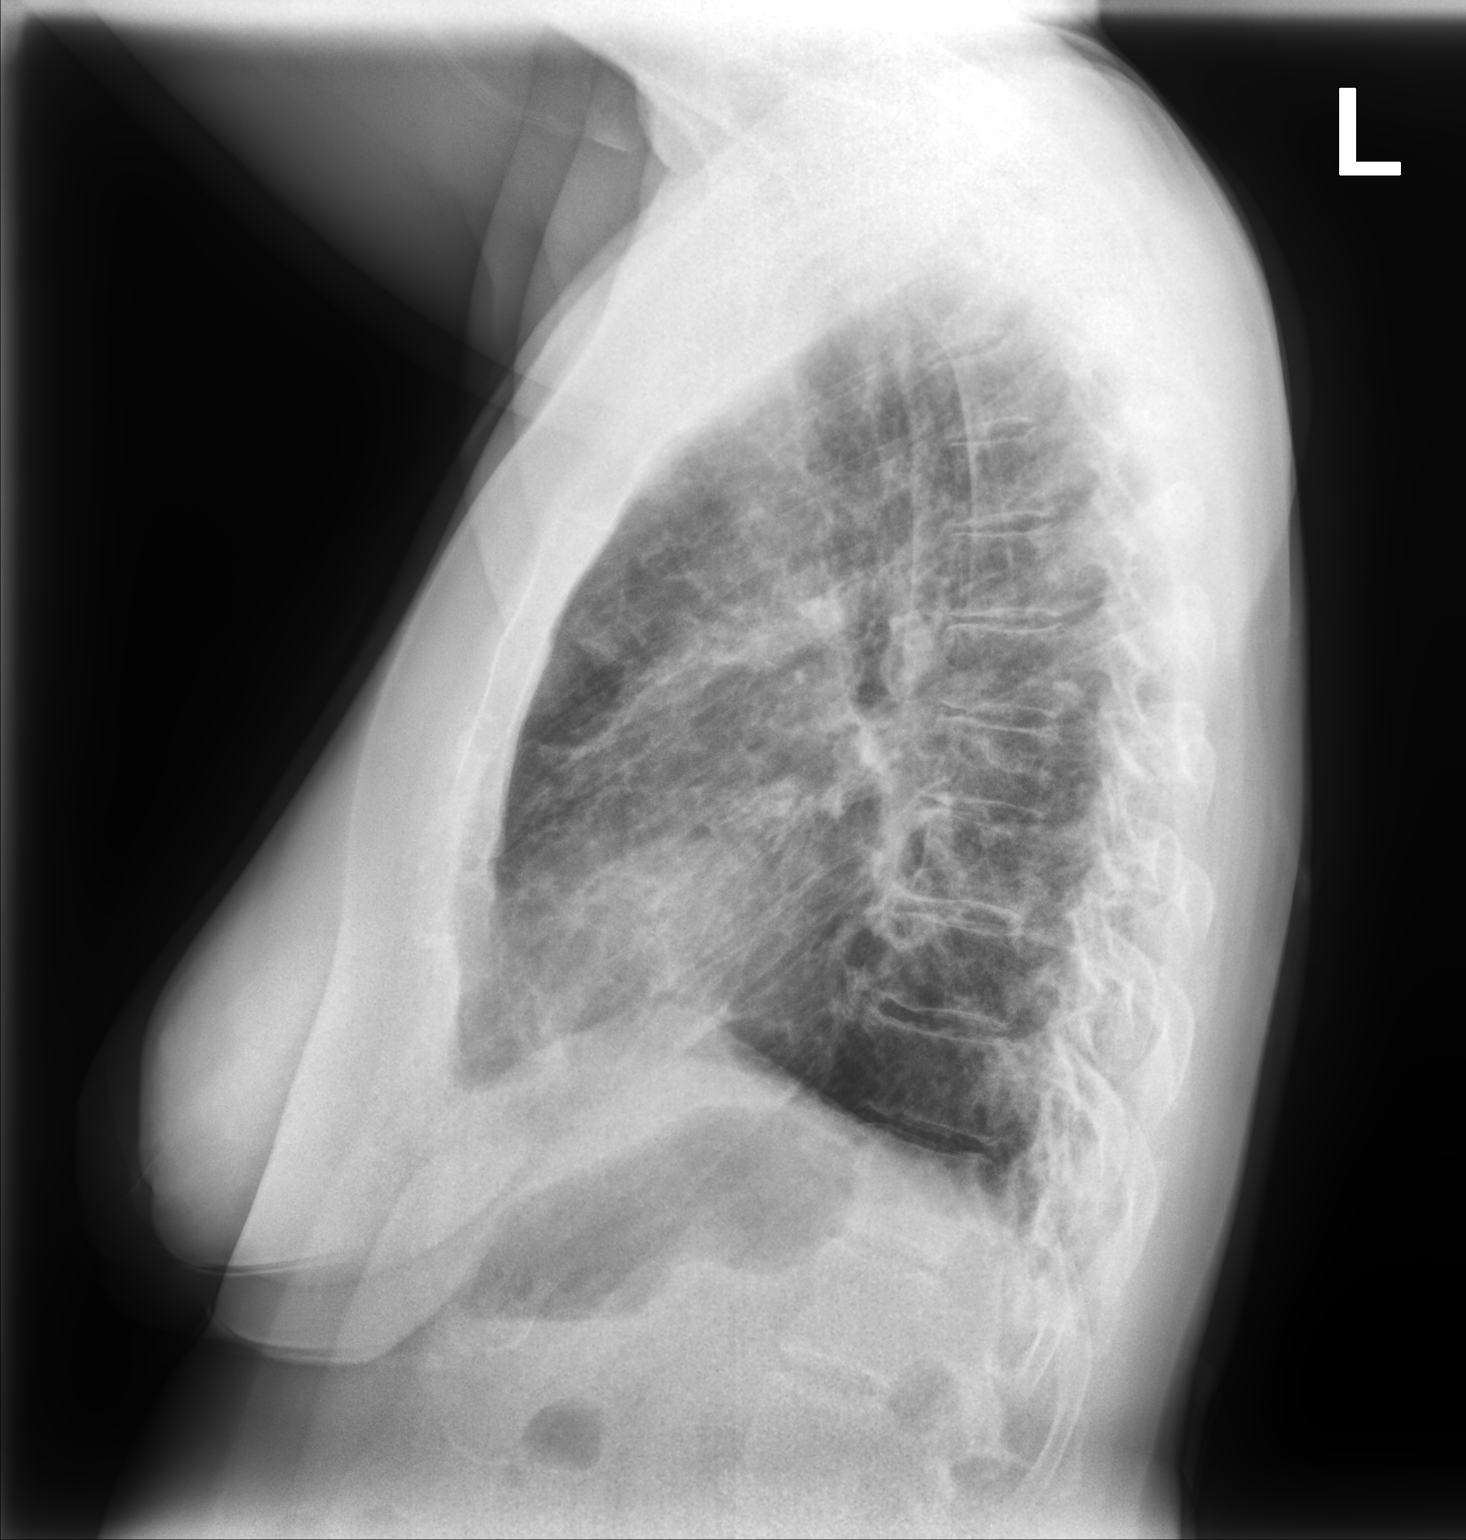

[2 of 2 positions shown; findings below may reference images not displayed]

FINDINGS: The heart size and mediastinal contours are within normal limits.
There is slight interval improvement of diffuse bilateral
interstitial and heterogeneous airspace opacity. No new or focal
airspace opacity. The visualized skeletal structures are
unremarkable.
IMPRESSION: Slight interval improvement of diffuse bilateral interstitial and
heterogeneous airspace opacity, in keeping with interval
improvement, although significantly persistent multifocal infection
and in keeping with S85Q8-R2. No new or focal airspace opacity. CT
may be used to further evaluate for chronic post
infectious/inflammatory fibrotic change if clinically suspected.

## 2020-07-04 IMAGING — CT CT CHEST HIGH RESOLUTION W/O CM
2 of 7 series · 14 of 36 positions shown, 17 images · non-contrast
Comparison: 11/13/2019 chest radiograph.

CLINICAL DATA: UDBQD-OE pneumonia September 2019 with 2
hospitalizations. Evaluate for interstitial lung disease.

EXAM:
CT CHEST WITHOUT CONTRAST
TECHNIQUE: Multidetector CT imaging of the chest was performed following the
standard protocol without intravenous contrast. High resolution
imaging of the lungs, as well as inspiratory and expiratory imaging,
was performed.

[Series 4: high resolution · axial · 0.60mm/px · z∈[-284,-42]mm · 11 of 292 slices shown, 14 images]
[im 25/292  mediastinal]
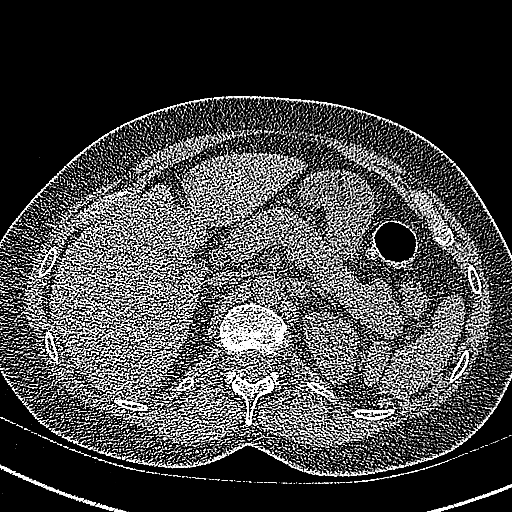
[im 25/292  lung]
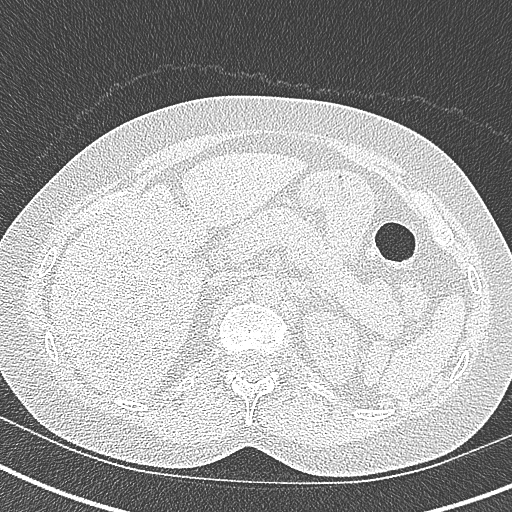
[im 49/292  lung]
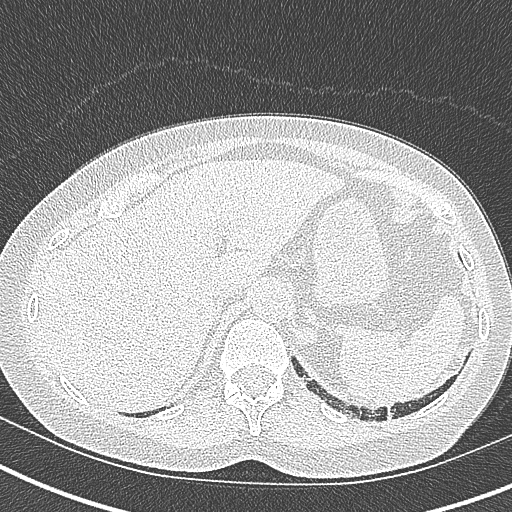
[im 73/292  lung]
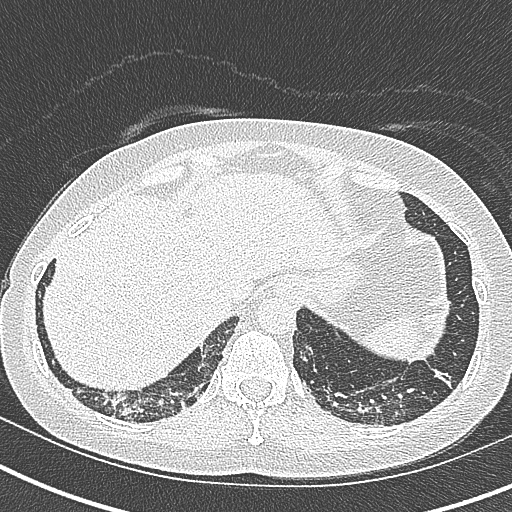
[im 98/292  lung]
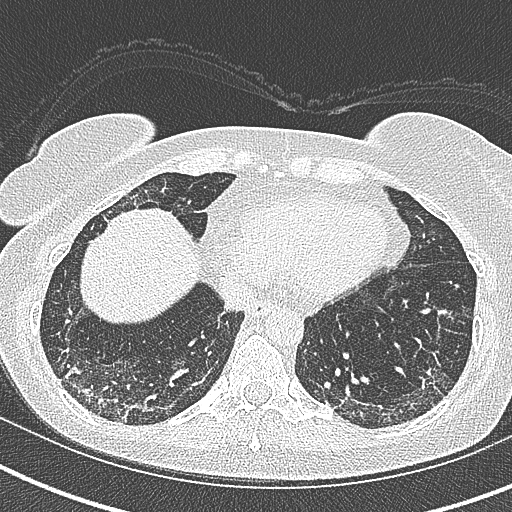
[im 122/292  mediastinal]
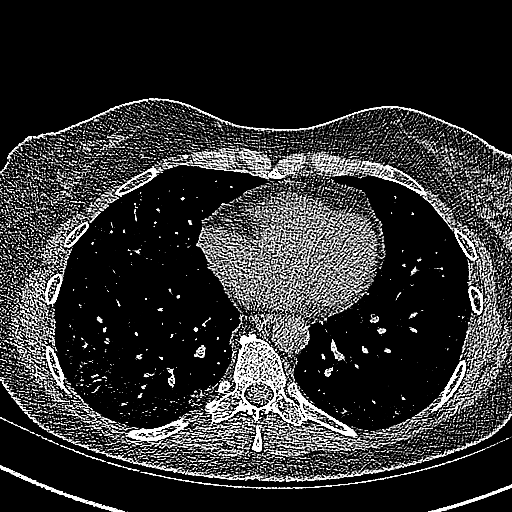
[im 122/292  lung]
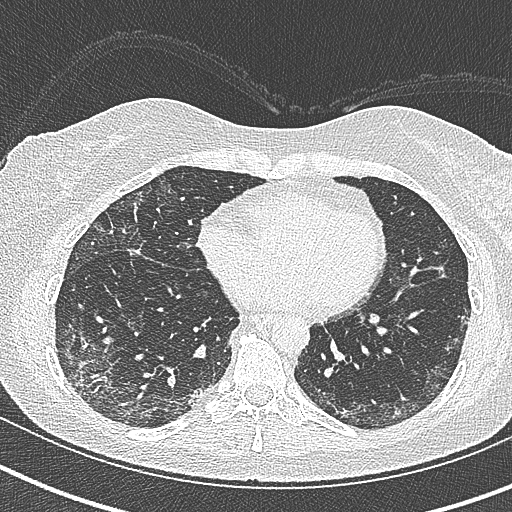
[im 146/292  lung]
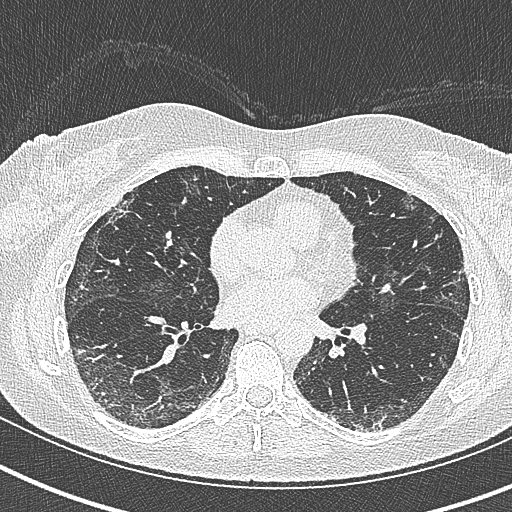
[im 170/292  lung]
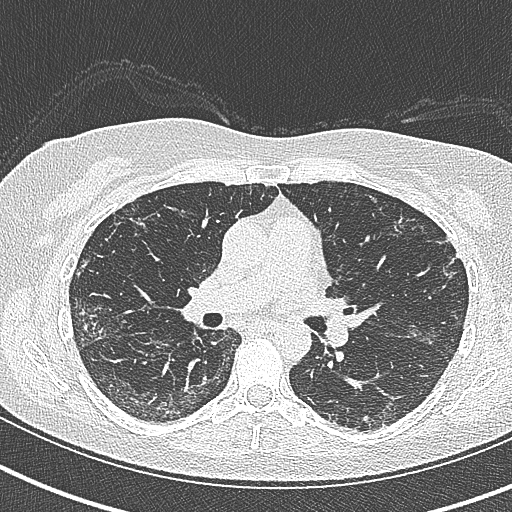
[im 195/292  lung]
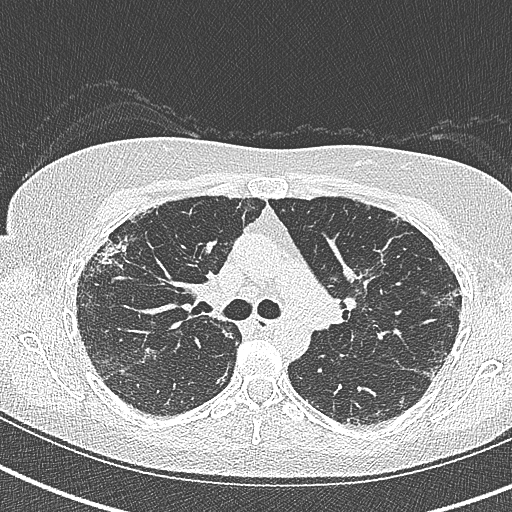
[im 219/292  mediastinal]
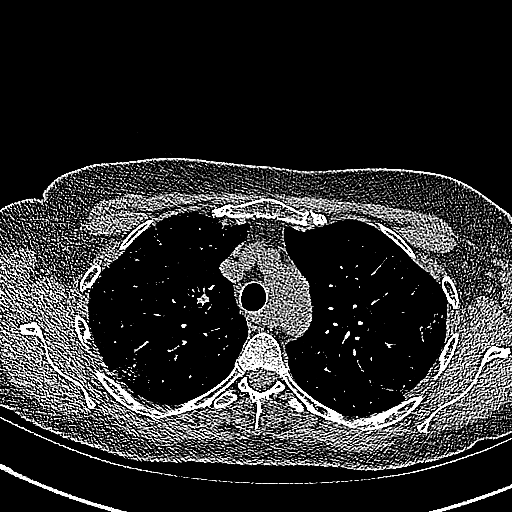
[im 219/292  lung]
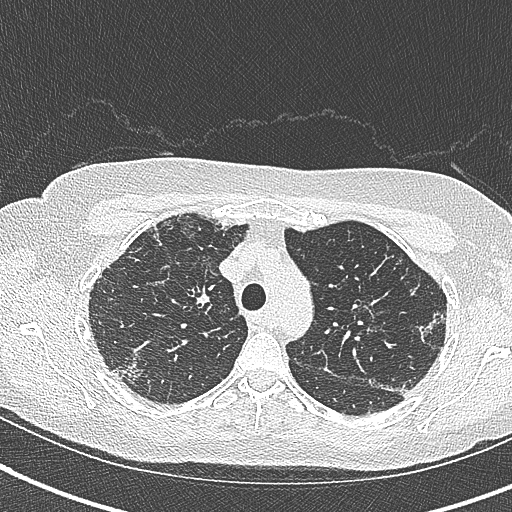
[im 243/292  lung]
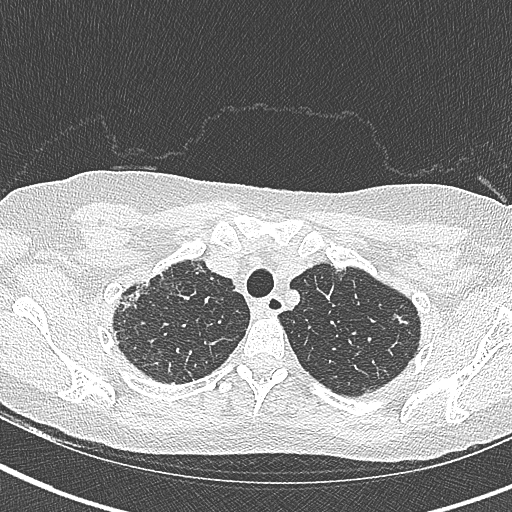
[im 267/292  lung]
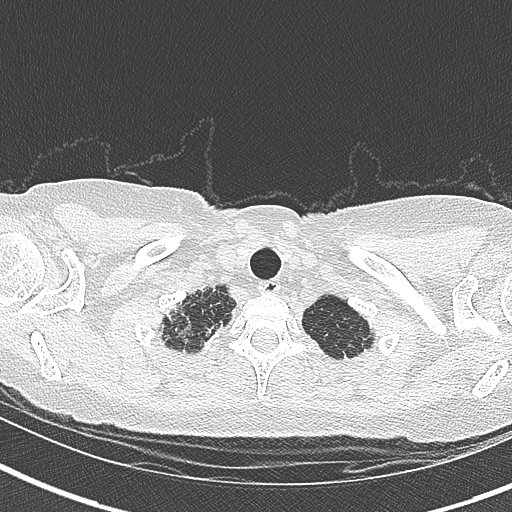

[Series 6: coronal · coronal · 0.59mm/px · 3 of 103 slices shown]
[im 21/103  lung]
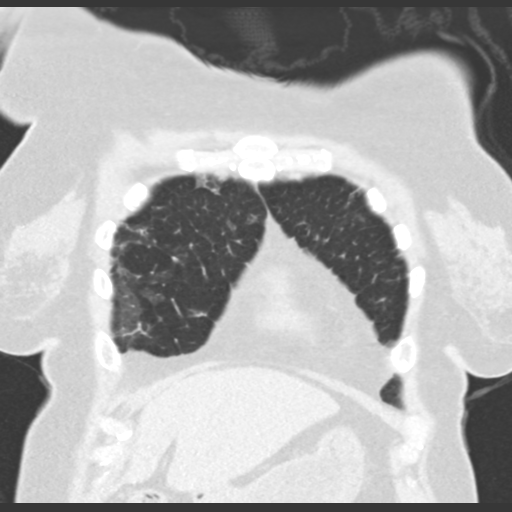
[im 41/103  lung]
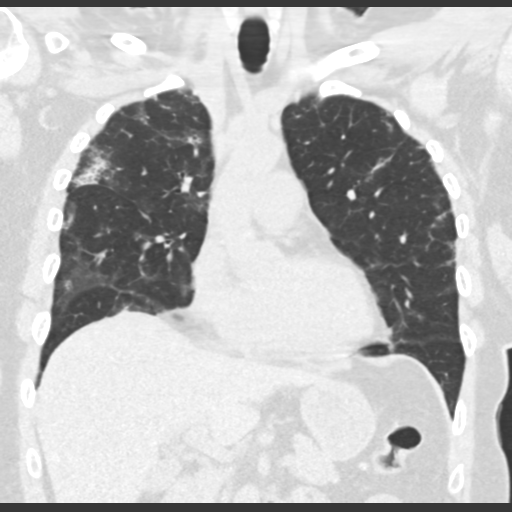
[im 62/103  lung]
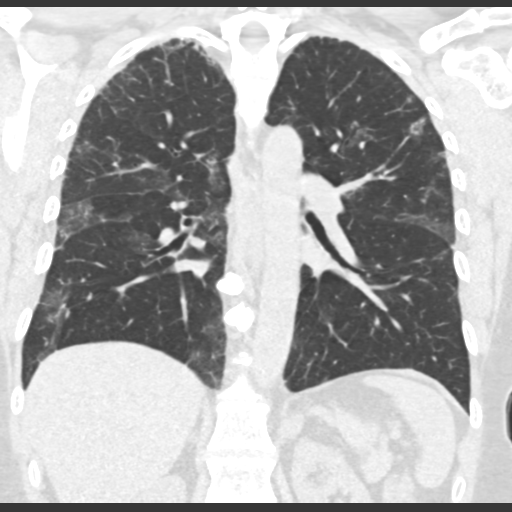

[14 of 36 positions shown; findings below may reference images not displayed]

FINDINGS: Cardiovascular: Normal heart size. No significant pericardial
effusion/thickening. Normal course and caliber of the thoracic
aorta. Aberrant nonaneurysmal right subclavian artery arising from
the distal arch with retroesophageal course. Normal caliber
pulmonary arteries.

Mediastinum/Nodes: No discrete thyroid nodules. Unremarkable
esophagus. No pathologically enlarged axillary, mediastinal or hilar
lymph nodes, noting limited sensitivity for the detection of hilar
adenopathy on this noncontrast study.

Lungs/Pleura: No pneumothorax. No pleural effusion. No acute
consolidative airspace disease. No central airway stenoses. No lung
masses or significant pulmonary nodules. No significant air trapping
or evidence of tracheobronchomalacia on the expiration sequence.
There is widespread patchy and confluent peripheral predominant
ground-glass opacity in both lungs with associated reticulation,
architectural distortion, septal thickening and perilobular lines.
No clear apicobasilar gradient to these findings. Minimal traction
bronchiolectasis. No frank honeycombing.

Upper abdomen: Colonic diverticulosis.

Musculoskeletal: No aggressive appearing focal osseous lesions. Mild
thoracic spondylosis.
IMPRESSION: 1. Spectrum of findings compatible with evolving postinflammatory
fibrosis due to recent UDBQD-OE pneumonia. Consider follow-up
high-resolution chest CT study in 6-12 months to assess temporal
pattern stability, as clinically warranted.
2. Aberrant right subclavian artery.

## 2020-07-21 ENCOUNTER — Other Ambulatory Visit: Payer: 59

## 2021-05-19 ENCOUNTER — Other Ambulatory Visit: Payer: Self-pay | Admitting: Family Medicine

## 2024-03-23 ENCOUNTER — Ambulatory Visit (HOSPITAL_BASED_OUTPATIENT_CLINIC_OR_DEPARTMENT_OTHER)
Admission: RE | Admit: 2024-03-23 | Discharge: 2024-03-23 | Disposition: A | Discharge status: Home / Self Care | Source: Ambulatory Visit

## 2024-03-23 ENCOUNTER — Encounter (HOSPITAL_BASED_OUTPATIENT_CLINIC_OR_DEPARTMENT_OTHER): Payer: Self-pay

## 2024-03-23 VITALS — BP 145/81 | HR 71 | Temp 98.1°F | Resp 20

## 2024-03-23 DIAGNOSIS — J0110 Acute frontal sinusitis, unspecified: Secondary | ICD-10-CM

## 2024-03-23 DIAGNOSIS — H9201 Otalgia, right ear: Secondary | ICD-10-CM | POA: Diagnosis not present

## 2024-03-23 MED ORDER — CEFDINIR 300 MG PO CAPS: 300.0000 mg | ORAL_CAPSULE | Freq: Two times a day (BID) | ORAL | 0 refills | Status: DC

## 2024-03-23 MED ORDER — PREDNISONE 20 MG PO TABS: 20.0000 mg | ORAL_TABLET | Freq: Every day | ORAL | 0 refills | Status: AC

## 2024-03-23 MED ORDER — CEFDINIR 300 MG PO CAPS: 300.0000 mg | ORAL_CAPSULE | Freq: Two times a day (BID) | ORAL | 0 refills | Status: AC

## 2024-03-23 NOTE — Discharge Instructions (Addendum)
 Treating you for a sinus infection and ear tube dysfunction Follow-up with your doctor for any continued issues Take the prednisone  with food.

## 2024-03-23 NOTE — ED Triage Notes (Signed)
 Right ear pressure and pain onset earlier this week. Pain from right ear radiates down to lower jaw and neck. States experiencing sinus pressure and drainage.No fever or cough.

## 2024-03-26 NOTE — ED Provider Notes (Signed)
 [facility]    CSN: [id number] Arrival date & time: 03/23/24  1305      History   Chief Complaint Chief Complaint  Patient presents with   Ear Fullness    Sinus headache all week. Right ear hurting. - Entered by patient    HPI Heidi Schmidt is a 63 y.o. female.   Patient is a 63 year old female who presents with right ear pressure and pain onset earlier this week. Pain from right ear radiates down to lower jaw and neck. States experiencing sinus pressure and drainage.No fever or cough.   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      Home Medications    Prior to Admission medications   Medication Sig Start Date End Date Taking? Authorizing Provider  famotidine  (PEPCID ) 20 MG tablet Take 20 mg by mouth daily. 03/08/23  Yes [provider]  naproxen sodium (ALEVE) 220 MG tablet Take 220 mg by mouth daily as needed.   Yes [provider]  predniSONE  (DELTASONE ) 20 MG tablet Take 1 tablet (20 mg total) by mouth daily with breakfast for 5 days. 03/23/24 03/28/24 Yes Emmilee Reamer A, FNP  cefdinir (OMNICEF) 300 MG capsule Take 1 capsule (300 mg total) by mouth 2 (two) times daily for 7 days. 03/23/24 03/30/24  Jerri Morale A, FNP  cetirizine  (ZYRTEC ) 10 MG tablet TAKE 1 TABLET(10 MG) BY MOUTH AT BEDTIME 05/19/21   Cox, Kirsten, MD   glucosamine-chondroitin 500-400 MG tablet Take 1 tablet by mouth 3 (three) times daily.    [provider]  ketoconazole (NIZORAL) 2 % shampoo Apply 1 application topically 2 (two) times a week. 12/10/19   [provider]  Multiple Vitamin (MULTIVITAMIN WITH MINERALS) TABS tablet Take 1 tablet by mouth daily.    [provider]  nystatin cream (MYCOSTATIN) Apply 1 application topically 2 (two) times daily.    [provider]  triamcinolone cream (KENALOG) 0.1 % APPLY TO BACK THREE TIMES DAILY FOR 2 WEEKS 12/10/19   [provider]    Family History Family History  Problem Relation Age of Onset   Hypertension Mother    Diabetes Mother    Cancer Mother    Heart disease Mother    Diabetes Brother    Diabetes Maternal Grandfather     Social History Social History   Tobacco Use   Smoking status: Never   Smokeless tobacco: Never  Vaping Use   Vaping status: Never Used  Substance Use Topics   Alcohol  use: Not Currently   Drug use: Never     Allergies   Dexamethasone , Sertraline  hcl, Cephalexin, Penicillins, and Sulfa antibiotics   Review of Systems Review of Systems See HPI  Physical Exam Triage Vital Signs ED Triage Vitals  Encounter Vitals Group     BP 03/23/24  1329 (!) 145/81     Systolic BP Percentile --      Diastolic BP Percentile --      Pulse Rate 03/23/24 1329 71     Resp 03/23/24 1329 20     Temp 03/23/24 1329 98.1 F (36.7 C)     Temp Source 03/23/24 1329 Oral     SpO2 03/23/24 1329 98 %     Weight --      Height --      Head Circumference --      Peak Flow --      Pain Score 03/23/24 1330 5     Pain Loc --      Pain Education --      Exclude from Growth Chart --    No data found.  Updated Vital Signs BP (!) 145/81 (BP Location: Right Arm)   Pulse 71   Temp 98.1 F (36.7 C) (Oral)   Resp 20   SpO2 98%   Visual Acuity Right Eye Distance:   Left Eye Distance:   Bilateral Distance:    Right Eye  Near:   Left Eye Near:    Bilateral Near:     Physical Exam Vitals and nursing note reviewed.  Constitutional:      General: She is not in acute distress.    Appearance: Normal appearance. She is not ill-appearing, toxic-appearing or diaphoretic.  HENT:     Head: Normocephalic and atraumatic.     Right Ear: Tympanic membrane and ear canal normal.     Left Ear: Tympanic membrane and ear canal normal.     Nose: Congestion and rhinorrhea present.     Mouth/Throat:     Pharynx: Oropharynx is clear.  Eyes:     Conjunctiva/sclera: Conjunctivae normal.  Cardiovascular:     Rate and Rhythm: Normal rate and regular rhythm.     Pulses: Normal pulses.     Heart sounds: Normal heart sounds.  Pulmonary:     Effort: Pulmonary effort is normal.     Breath sounds: Normal breath sounds.  Skin:    General: Skin is warm and dry.  Neurological:     Mental Status: She is alert.  Psychiatric:        Mood and Affect: Mood normal.      UC Treatments / Results  Labs (all labs ordered are listed, but only abnormal results are displayed) Labs Reviewed - No data to display  EKG   Radiology No results found.  Procedures Procedures (including critical care time)  Medications Ordered in UC Medications - No data to display  Initial Impression / Assessment and Plan / UC Course  I have reviewed the triage vital signs and the nursing notes.  Pertinent labs & imaging results that were available during my care of the patient were reviewed by me and considered in my medical decision making (see chart for details).     Acute sinusitis with right ear pain.-Most likely the cause of symptoms.  I am prescribing prednisone  for sinus and ear tube inflammation and cefdinir for infection.  May continue over-the-counter medications for symptoms as needed.  Recommend follow-up as needed Final Clinical Impressions(s) / UC Diagnoses   Final diagnoses:  Acute non-recurrent frontal sinusitis  Right ear  pain     Discharge Instructions      Treating you for a sinus infection and ear tube dysfunction Follow-up with your doctor for any continued issues Take the prednisone  with food.   ED Prescriptions  Medication Sig Dispense Auth. Provider   predniSONE  (DELTASONE ) 20 MG tablet Take 1 tablet (20 mg total) by mouth daily with breakfast for 5 days. 5 tablet Yoshiaki Kreuser A, FNP   cefdinir (OMNICEF) 300 MG capsule  (Status: Discontinued) Take 1 capsule (300 mg total) by mouth 2 (two) times daily for 7 days. 14 capsule Kayanna Mckillop A, FNP   cefdinir (OMNICEF) 300 MG capsule Take 1 capsule (300 mg total) by mouth 2 (two) times daily for 7 days. 14 capsule Jerri Morale A, FNP      PDMP not reviewed this encounter.   Landa Pine, FNP 03/26/24 [phone]

## 2024-06-12 ENCOUNTER — Ambulatory Visit (HOSPITAL_BASED_OUTPATIENT_CLINIC_OR_DEPARTMENT_OTHER): Admitting: Radiology

## 2024-06-12 ENCOUNTER — Ambulatory Visit (HOSPITAL_BASED_OUTPATIENT_CLINIC_OR_DEPARTMENT_OTHER)
Admission: RE | Admit: 2024-06-12 | Discharge: 2024-06-12 | Disposition: A | Discharge status: Home / Self Care | Source: Ambulatory Visit | Attending: Family Medicine | Admitting: Family Medicine

## 2024-06-12 ENCOUNTER — Encounter (HOSPITAL_BASED_OUTPATIENT_CLINIC_OR_DEPARTMENT_OTHER): Payer: Self-pay

## 2024-06-12 VITALS — BP 140/85 | HR 113 | Temp 98.6°F | Resp 18

## 2024-06-12 DIAGNOSIS — R112 Nausea with vomiting, unspecified: Secondary | ICD-10-CM

## 2024-06-12 DIAGNOSIS — R1032 Left lower quadrant pain: Secondary | ICD-10-CM | POA: Diagnosis not present

## 2024-06-12 DIAGNOSIS — K5900 Constipation, unspecified: Secondary | ICD-10-CM

## 2024-06-12 DIAGNOSIS — K5732 Diverticulitis of large intestine without perforation or abscess without bleeding: Secondary | ICD-10-CM

## 2024-06-12 DIAGNOSIS — R109 Unspecified abdominal pain: Secondary | ICD-10-CM

## 2024-06-12 DIAGNOSIS — R1012 Left upper quadrant pain: Secondary | ICD-10-CM | POA: Diagnosis not present

## 2024-06-12 DIAGNOSIS — K921 Melena: Secondary | ICD-10-CM

## 2024-06-12 MED ORDER — FLORAJEN DIGESTION PO CAPS: 1.0000 | ORAL_CAPSULE | Freq: Every day | ORAL | 0 refills | Status: AC

## 2024-06-12 MED ORDER — METRONIDAZOLE 500 MG PO TABS: 500.0000 mg | ORAL_TABLET | Freq: Three times a day (TID) | ORAL | 0 refills | Status: DC

## 2024-06-12 MED ORDER — FLORAJEN DIGESTION PO CAPS: 1.0000 | ORAL_CAPSULE | Freq: Every day | ORAL | 0 refills | Status: DC

## 2024-06-12 MED ORDER — CIPROFLOXACIN HCL 500 MG PO TABS: 500.0000 mg | ORAL_TABLET | Freq: Two times a day (BID) | ORAL | 0 refills | Status: AC

## 2024-06-12 MED ORDER — METRONIDAZOLE 500 MG PO TABS: 500.0000 mg | ORAL_TABLET | Freq: Three times a day (TID) | ORAL | 0 refills | Status: AC

## 2024-06-12 MED ORDER — CIPROFLOXACIN HCL 500 MG PO TABS: 500.0000 mg | ORAL_TABLET | Freq: Two times a day (BID) | ORAL | 0 refills | Status: DC

## 2024-06-12 NOTE — ED Triage Notes (Signed)
 Pt c/o stomach cramping and feeling like she needed to use the bathroom but she would only throw up, if she laid flat on her back the pain was not as bad she did take one dose of miralax at 10 am this morning and she did have a BM and after that BM she had diarrhea, pt also started running a fever this afternoon, she has tenderness to left side of abdomen, pt hx history of diverticulosis and diverticulitis.

## 2024-06-12 NOTE — Discharge Instructions (Addendum)
 Abdominal pain with constipation and nausea and vomiting and probable diverticulitis: History, exam and abdominal x-ray are consistent with diverticulitis.  Will treat with Cipro , 500 mg, 1 pill twice daily for 10 days and metronidazole , 500 mg, 1 pill three daily for 10 days.  Use clear liquids for the next 2 to 3 days until bowels are moving more regularly.  Avoid laxatives but work on good fluid intake.  Blood in stool: This may be secondary to inflammation of the bowel secondary to diverticulitis.  However patient cautioned that if she sees bright red blood in the stool or has persistent bloody stools, she needs to go to an emergency room for further evaluation and management.  Get help right away if: You have more bleeding, or you pass blood clots in your poop. You have sudden or very bad cramps in your back or belly. You throw up blood. You faint. You have chest pain. You have fast heartbeats. These symptoms may be an emergency. Call 911 right away. Do not wait to see if the symptoms will go away. Do not drive yourself to the hospital.  Follow-up if symptoms do not improve, worsen or new symptoms occur.

## 2024-06-12 NOTE — ED Provider Notes (Signed)
 [facility]    CSN: [id number] Arrival date & time: 06/12/24  1730      History   Chief Complaint Chief Complaint  Patient presents with   Abdominal Pain    Entered by patient    HPI Heidi Schmidt is a 63 y.o. female.   63 year old female here with complaint of abdominal cramping and nausea with dry heaves and some vomiting.  Symptoms started on 05/2724.  She has a history of diverticulosis on colonoscopy and has had 1 episode of diverticulitis a few years ago.  She has not had a good bowel movement in several days.  She did take MiraLAX this morning around 10 AM.  She has mostly only had gas and some loose stools for the last 24 hours.  She has not felt like she had a fever but she has just had significant abdominal cramping pain nausea and vomiting.  While here in the office, she had a bowel movement and there was blood in the stool with the bowel movement.   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      Home Medications    Prior to Admission medications   Medication Sig Start Date End Date Taking? Authorizing Provider  cetirizine  (ZYRTEC )  10 MG tablet TAKE 1 TABLET(10 MG) BY MOUTH AT BEDTIME 05/19/21   Cox, Kirsten, MD  ciprofloxacin  (CIPRO ) 500 MG tablet Take 1 tablet (500 mg total) by mouth 2 (two) times daily after a meal for 10 days. 06/12/24 06/22/24  Ival Domino, FNP  famotidine  (PEPCID ) 20 MG tablet Take 20 mg by mouth daily. 03/08/23   [provider]  glucosamine-chondroitin 500-400 MG tablet Take 1 tablet by mouth 3 (three) times daily.    [provider]  ketoconazole (NIZORAL) 2 % shampoo Apply 1 application topically 2 (two) times a week. 12/10/19   [provider]  metroNIDAZOLE  (FLAGYL ) 500 MG tablet Take 1 tablet (500 mg total) by mouth 3 (three) times daily after meals for 10 days. 06/12/24 06/22/24  Ival Domino, FNP  Multiple Vitamin (MULTIVITAMIN WITH MINERALS) TABS tablet Take 1 tablet by mouth daily.    [provider]  naproxen sodium (ALEVE) 220 MG tablet Take 220 mg by mouth daily as needed.    [provider]  nystatin cream (MYCOSTATIN) Apply 1 application topically 2 (two) times daily.    [provider]  Probiotic Product [facility] DIGESTION) CAPS Take 1 capsule by mouth daily. 06/12/24   Ival Domino, FNP  triamcinolone cream (KENALOG) 0.1 % APPLY TO BACK THREE TIMES DAILY FOR 2 WEEKS 12/10/19   [provider]  Family History Family History  Problem Relation Age of Onset   Hypertension Mother    Diabetes Mother    Cancer Mother    Heart disease Mother    Diabetes Brother    Diabetes Maternal Grandfather     Social History Social History   Tobacco Use   Smoking status: Never   Smokeless tobacco: Never  Vaping Use   Vaping status: Never Used  Substance Use Topics   Alcohol  use: Not Currently   Drug use: Never     Allergies   Dexamethasone , Sertraline  hcl, Cephalexin, Penicillins, and Sulfa antibiotics   Review of Systems Review of Systems  Constitutional:  Negative for chills and fever.  HENT:  Negative for ear pain and  sore throat.   Eyes:  Negative for pain and visual disturbance.  Respiratory:  Negative for cough and shortness of breath.   Cardiovascular:  Negative for chest pain and palpitations.  Gastrointestinal:  Positive for abdominal distention, abdominal pain, blood in stool, constipation, diarrhea, nausea and vomiting.  Genitourinary:  Negative for dysuria and hematuria.  Musculoskeletal:  Negative for arthralgias and back pain.  Skin:  Negative for color change and rash.  Neurological:  Negative for seizures and syncope.  All other systems reviewed and are negative.    Physical Exam Triage Vital Signs ED Triage Vitals  Encounter Vitals Group     BP 06/12/24 1742 (!) 140/85     Girls Systolic BP Percentile --      Girls Diastolic BP Percentile --      Boys Systolic BP Percentile --      Boys Diastolic BP Percentile --      Pulse Rate 06/12/24 1742 (!) 113     Resp 06/12/24 1742 18     Temp 06/12/24 1742 98.6 F (37 C)     Temp Source 06/12/24 1742 Oral     SpO2 06/12/24 1742 95 %     Weight --      Height --      Head Circumference --      Peak Flow --      Pain Score 06/12/24 1741 5     Pain Loc --      Pain Education --      Exclude from Growth Chart --    No data found.  Updated Vital Signs BP (!) 140/85 (BP Location: Right Arm)   Pulse (!) 113   Temp 98.6 F (37 C) (Oral)   Resp 18   SpO2 95%   Visual Acuity Right Eye Distance:   Left Eye Distance:   Bilateral Distance:    Right Eye Near:   Left Eye Near:    Bilateral Near:     Physical Exam Vitals and nursing note reviewed.  Constitutional:      General: She is not in acute distress.    Appearance: She is well-developed. She is not ill-appearing or toxic-appearing.  HENT:     Head: Normocephalic and atraumatic.     Right Ear: Hearing, tympanic membrane, ear canal and external ear normal.     Left Ear: Hearing, tympanic membrane, ear canal and external ear normal.     Nose: No congestion or rhinorrhea.      Right Sinus: No maxillary sinus tenderness or frontal sinus tenderness.     Left Sinus: No maxillary sinus tenderness or frontal sinus tenderness.     Mouth/Throat:     Lips: Pink.     Mouth: Mucous membranes are moist.  Pharynx: Uvula midline. No oropharyngeal exudate or posterior oropharyngeal erythema.     Tonsils: No tonsillar exudate.  Eyes:     Conjunctiva/sclera: Conjunctivae normal.     Pupils: Pupils are equal, round, and reactive to light.  Cardiovascular:     Rate and Rhythm: Normal rate and regular rhythm.     Heart sounds: S1 normal and S2 normal. No murmur heard. Pulmonary:     Effort: Pulmonary effort is normal. No respiratory distress.     Breath sounds: Normal breath sounds. No decreased breath sounds, wheezing, rhonchi or rales.  Abdominal:     General: Bowel sounds are normal.     Palpations: Abdomen is soft.     Tenderness: There is abdominal tenderness in the left upper quadrant and left lower quadrant. There is no right CVA tenderness, left CVA tenderness, guarding or rebound. Negative signs include Murphy's sign, Rovsing's sign and McBurney's sign.     Comments: Small amount of blood in the toilet around her brown stool.  Musculoskeletal:        General: No swelling.     Cervical back: Neck supple.  Lymphadenopathy:     Head:     Right side of head: No submental, submandibular, tonsillar, preauricular or posterior auricular adenopathy.     Left side of head: No submental, submandibular, tonsillar, preauricular or posterior auricular adenopathy.     Cervical: No cervical adenopathy.     Right cervical: No superficial cervical adenopathy.    Left cervical: No superficial cervical adenopathy.  Skin:    General: Skin is warm and dry.     Capillary Refill: Capillary refill takes less than 2 seconds.     Findings: No rash.  Neurological:     Mental Status: She is alert and oriented to person, place, and time.  Psychiatric:        Mood and Affect: Mood  normal.      UC Treatments / Results  Labs (all labs ordered are listed, but only abnormal results are displayed) Labs Reviewed - No data to display  EKG  Ileitis. Radiology DG Abd 2 Views Result Date: 06/12/2024 CLINICAL DATA:  Abdomen pain EXAM: ABDOMEN - 2 VIEW COMPARISON:  None Available. FINDINGS: Nonobstructed gas pattern. No significant stool burden. No radiopaque calculi. Probable phleboliths in the pelvis IMPRESSION: Nonobstructed gas pattern. Electronically Signed   By: Luke Bun M.D.   On: 06/12/2024 19:10    Procedures Procedures (including critical care time)  Medications Ordered in UC Medications - No data to display  Initial Impression / Assessment and Plan / UC Course  I have reviewed the triage vital signs and the nursing notes.  Pertinent labs & imaging results that were available during my care of the patient were reviewed by me and considered in my medical decision making (see chart for details).  Plan of Care: Abdominal pain with constipation and probable diverticulitis: Based on history, symptoms and abdominal x-ray results, will treat for acute diverticulitis without perforation.  Encouraged clear liquids for 1 to 3 days.  Provided a clear liquid diet.  Cipro  500 mg twice daily for 10 days and metronidazole , 500 mg, 1 pill 3 times daily with food for 10 days.  Provided education on diverticulitis.  See discharge instructions for additional information and specifics on cautions and safety regarding diverticulitis.  Specifically provided information about reasons to return to the emergency room if symptoms worsen.  Blood in stool: See discharge instructions but provided information on acutely worsening symptoms and  reasons to go to an emergency room.  This was a small amount of blood and may be just due to inflammation associated with diverticulitis.  If patient is having frank bloody stools with bright red blood, she needs to go to an emergency room for  further workup.  Follow-up with primary care for reevaluation and follow-up here as needed.  I reviewed the plan of care with the patient and/or the patient's guardian.  The patient and/or guardian had time to ask questions and acknowledged that the questions were answered.  I provided instruction on symptoms or reasons to return here or to go to an ER, if symptoms/condition did not improve, worsened or if new symptoms occurred.  Final Clinical Impressions(s) / UC Diagnoses   Final diagnoses:  Left lower quadrant abdominal pain  Left upper quadrant abdominal pain  Nausea and vomiting, unspecified vomiting type  Blood in stool  Constipation, unspecified constipation type  Diverticulitis of colon     Discharge Instructions      Abdominal pain with constipation and nausea and vomiting and probable diverticulitis: History, exam and abdominal x-ray are consistent with diverticulitis.  Will treat with Cipro , 500 mg, 1 pill twice daily for 10 days and metronidazole , 500 mg, 1 pill three daily for 10 days.  Use clear liquids for the next 2 to 3 days until bowels are moving more regularly.  Avoid laxatives but work on good fluid intake.  Blood in stool: This may be secondary to inflammation of the bowel secondary to diverticulitis.  However patient cautioned that if she sees bright red blood in the stool or has persistent bloody stools, she needs to go to an emergency room for further evaluation and management.  Get help right away if: You have more bleeding, or you pass blood clots in your poop. You have sudden or very bad cramps in your back or belly. You throw up blood. You faint. You have chest pain. You have fast heartbeats. These symptoms may be an emergency. Call 911 right away. Do not wait to see if the symptoms will go away. Do not drive yourself to the hospital.  Follow-up if symptoms do not improve, worsen or new symptoms occur.     ED Prescriptions     Medication Sig  Dispense Auth. Provider   ciprofloxacin  (CIPRO ) 500 MG tablet  (Status: Discontinued) Take 1 tablet (500 mg total) by mouth 2 (two) times daily after a meal for 10 days. 20 tablet Chavon Lucarelli, FNP   metroNIDAZOLE  (FLAGYL ) 500 MG tablet  (Status: Discontinued) Take 1 tablet (500 mg total) by mouth 3 (three) times daily after meals for 10 days. 30 tablet Geanine Vandekamp, FNP   Probiotic Product [facility] DIGESTION) CAPS  (Status: Discontinued) Take 1 capsule by mouth daily. 30 capsule Ival Domino, FNP   ciprofloxacin  (CIPRO ) 500 MG tablet Take 1 tablet (500 mg total) by mouth 2 (two) times daily after a meal for 10 days. 20 tablet Makinzi Prieur, FNP   metroNIDAZOLE  (FLAGYL ) 500 MG tablet Take 1 tablet (500 mg total) by mouth 3 (three) times daily after meals for 10 days. 30 tablet Brylee Berk, FNP   Probiotic Product [facility] DIGESTION) CAPS Take 1 capsule by mouth daily. 30 capsule Ival Domino, FNP      PDMP not reviewed this encounter.   Ival Domino, FNP 06/12/24 2021
